# Patient Record
Sex: Female | Born: 1980 | Hispanic: No | Marital: Married | Smoking: Never smoker
Health system: Southern US, Community
[De-identification: ages and names within clinical notes are randomized; demographics above are authoritative.]

## PROBLEM LIST (undated history)

## (undated) ENCOUNTER — Emergency Department (HOSPITAL_COMMUNITY): Admission: EM | Payer: Medicaid Other

## (undated) ENCOUNTER — Inpatient Hospital Stay (HOSPITAL_COMMUNITY): Admission: AD | Payer: Medicaid Other

## (undated) DIAGNOSIS — Z789 Other specified health status: Secondary | ICD-10-CM

## (undated) HISTORY — PX: INDUCED ABORTION: SHX677

---

## 2011-05-05 LAB — OB RESULTS CONSOLE ABO/RH: RH Type: POSITIVE

## 2011-05-05 LAB — OB RESULTS CONSOLE RPR: RPR: NONREACTIVE

## 2011-05-05 LAB — OB RESULTS CONSOLE HEPATITIS B SURFACE ANTIGEN: Hepatitis B Surface Ag: NEGATIVE

## 2011-06-17 NOTE — L&D Delivery Note (Signed)
I was present and involved in delivery, and repaired the partial 3rd degree laceration.

## 2011-06-17 NOTE — L&D Delivery Note (Signed)
Operative Delivery Note At  a viable and healthy female was delivered via VAVD.  Presentation: vertex; Position: Right,, Occiput,, Anterior; Station: +3. Right posterior shoulder rotated clockwise until it was released.  Dr Emelda Fear actively assisting with delivery.  Verbal consent: obtained from patient.  Risks and benefits discussed in detail.  Risks include, but are not limited to the risks of anesthesia, bleeding, infection, damage to maternal tissues, fetal cephalhematoma.  There is also the risk of inability to effect vaginal delivery of the head, or shoulder dystocia that cannot be resolved by established maneuvers, leading to the need for emergency cesarean section.  APGAR: 8,9 ; weight pending.   Placenta status: intact, spontaneous.   Cord: 3-vessel with the following complications: None .    Anesthesia:  Epidural  Repair by Dr Emelda Fear: Instruments: Cheri Guppy used for visibility, Allis clamps x2 used to grasp sphincter capsule, and figure-of-eight sutures x2 used to approximate sphincter, interuppted 3.0 vicryl x1 used on perineal body, then 3.0 monocryl x1 used for skin approximation. Episiotomy: None Lacerations: Partial 3rd degree Est. Blood Loss (mL): 500  Mom to postpartum.  Baby to nursery-stable.  [name], Shastina Rua 12/14/2011, 10:42 AM

## 2011-11-03 ENCOUNTER — Encounter (HOSPITAL_COMMUNITY): Payer: Self-pay | Admitting: *Deleted

## 2011-11-03 ENCOUNTER — Inpatient Hospital Stay (HOSPITAL_COMMUNITY)
Admission: AD | Admit: 2011-11-03 | Discharge: 2011-11-03 | Disposition: A | Discharge status: Home / Self Care | Payer: Medicaid Other | Source: Ambulatory Visit | Attending: Obstetrics & Gynecology | Admitting: Obstetrics & Gynecology

## 2011-11-03 DIAGNOSIS — Z331 Pregnant state, incidental: Secondary | ICD-10-CM

## 2011-11-03 DIAGNOSIS — O99891 Other specified diseases and conditions complicating pregnancy: Secondary | ICD-10-CM | POA: Insufficient documentation

## 2011-11-03 HISTORY — DX: Other specified health status: Z78.9

## 2011-11-03 NOTE — MAU Note (Signed)
Patient states she just arrived in the [place] last week from [place] and wants to get established at this hospital. Has an appointment next week at the Clinic. Having no problems.

## 2011-11-03 NOTE — MAU Provider Note (Signed)
History     CSN: [id number]  Arrival date and time: 11/03/11 4696   None     Chief Complaint  Patient presents with  . Labor Eval   HPI Comments: Pt presenting following trans-atlantic flight because she "wanted her baby checked".  She has an appointment with [facility] 1 week and has no other complaints today.  She is a G2P0010 @ 35.5 estimated by 9 week [place].  Documentation from [place] indicating her EDD was 12/24/2011 which is incorrect as [place] provided by pt is more reliable.  Has no other complaints.  Minimal leg swelling B; not worsened.  No leg pain. No bleeding, no discharge. No headaches, no dyspnea, no orthopnea   OB History    Grav Para Term Preterm Abortions TAB SAB Ect Mult Living   2    1           Past Medical History  Diagnosis Date  . No pertinent past medical history     Past Surgical History  Procedure Date  . No past surgeries     Family History  Problem Relation Age of Onset  . Anesthesia problems Neg Hx   . Hypotension Neg Hx   . Malignant hyperthermia Neg Hx   . Pseudochol deficiency Neg Hx     History  Substance Use Topics  . Smoking status: Never Smoker   . Smokeless tobacco: Not on file  . Alcohol Use: No    Allergies: No Known Allergies  Prescriptions prior to admission  Medication Sig Dispense Refill  . Prenatal Vit-Fe Fumarate-FA (PRENATAL MULTIVITAMIN) TABS Take 1 tablet by mouth daily.        Review of Systems  Constitutional: Negative.  Negative for fever and chills.  HENT: Negative.   Eyes: Negative.   Respiratory: Negative.   Cardiovascular: Negative.   Gastrointestinal: Negative.   Genitourinary: Negative.   Musculoskeletal: Negative.   Skin: Negative.   Neurological: Negative.   Endo/Heme/Allergies: Negative.   Psychiatric/Behavioral: Negative.    Physical Exam   Blood pressure 115/74, pulse 93, temperature 98.2 F (36.8 C), resp. rate 18, height 5\' 2"  (1.575 m), weight 62.687 kg (138 lb 3.2  oz).  Physical Exam  Nursing note and vitals reviewed. Constitutional: She appears well-developed and well-nourished.  HENT:  Head: Normocephalic and atraumatic.  Eyes: Right eye exhibits no discharge. Left eye exhibits no discharge. No scleral icterus.  Cardiovascular: Normal rate, regular rhythm and intact distal pulses.  Exam reveals no gallop and no friction rub.   Murmur heard.      3+/6 systolic murmur  Respiratory: Effort normal and breath sounds normal. No respiratory distress. She has no wheezes. She has no rales.  GI: Soft. Bowel sounds are normal. She exhibits distension and mass. There is no tenderness. There is no rebound and no guarding.  Musculoskeletal: She exhibits edema.       Trace B edema  Skin: Skin is warm and dry.  Psychiatric: She has a normal mood and affect. Her behavior is normal. Judgment and thought content normal.    MAU Course  Procedures  MDM Pt with normal pregnancy who received prenatal care prior to arrival to [place].  Has no acute complaints.  No s/sx of complications including pre-eclampsia, DVT or CHF.  Pt was unaware of Heart Murmur.  Will need to be followed clinically, no indication for acute ECHO.  Assessment and Plan  Normal Pregnancy.  Follow up in [facility] as scheduled for remainder of prenatal labs  including Glucola.  Andrena Mews, DO [facility] Resident - PGY-1 11/03/2011 12:39 PM

## 2011-11-03 NOTE — MAU Provider Note (Signed)
Agree with Dr. Janeece Riggers note.

## 2011-11-03 NOTE — MAU Note (Signed)
Pt had a 17 hour flight and just wants a check-up today; has an appointment with our clinic next week;

## 2011-11-03 NOTE — Discharge Instructions (Signed)
Pregnancy - Third Trimester The third trimester of pregnancy (the last 3 months) is a period of the most rapid growth for you and your baby. The baby approaches a length of 20 inches and a weight of 6 to 10 pounds. The baby is adding on fat and getting ready for life outside your body. While inside, babies have periods of sleeping and waking, suck their thumbs, and hiccups. You can often feel small contractions of the uterus. This is false labor. It is also called Braxton-Hicks contractions. This is like a practice for labor. The usual problems in this stage of pregnancy include more difficulty breathing, swelling of the hands and feet from water retention, and having to urinate more often because of the uterus and baby pressing on your bladder.  PRENATAL EXAMS  Blood work may continue to be done during prenatal exams. These tests are done to check on your health and the probable health of your baby. Blood work is used to follow your blood levels (hemoglobin). Anemia (low hemoglobin) is common during pregnancy. Iron and vitamins are given to help prevent this. You may also continue to be checked for diabetes. Some of the past blood tests may be done again.   The size of the uterus is measured during each visit. This makes sure your baby is growing properly according to your pregnancy dates.   Your blood pressure is checked every prenatal visit. This is to make sure you are not getting toxemia.   Your urine is checked every prenatal visit for infection, diabetes and protein.   Your weight is checked at each visit. This is done to make sure gains are happening at the suggested rate and that you and your baby are growing normally.   Sometimes, an ultrasound is performed to confirm the position and the proper growth and development of the baby. This is a test done that bounces harmless sound waves off the baby so your caregiver can more accurately determine due dates.   Discuss the type of pain  medication and anesthesia you will have during your labor and delivery.   Discuss the possibility and anesthesia if a Cesarean Section might be necessary.   Inform your caregiver if there is any mental or physical violence at home.  Sometimes, a specialized non-stress test, contraction stress test and biophysical profile are done to make sure the baby is not having a problem. Checking the amniotic fluid surrounding the baby is called an amniocentesis. The amniotic fluid is removed by sticking a needle into the belly (abdomen). This is sometimes done near the end of pregnancy if an early delivery is required. In this case, it is done to help make sure the baby's lungs are mature enough for the baby to live outside of the womb. If the lungs are not mature and it is unsafe to deliver the baby, an injection of cortisone medication is given to the mother 1 to 2 days before the delivery. This helps the baby's lungs mature and makes it safer to deliver the baby. CHANGES OCCURING IN THE THIRD TRIMESTER OF PREGNANCY Your body goes through many changes during pregnancy. They vary from person to person. Talk to your caregiver about changes you notice and are concerned about.  During the last trimester, you have probably had an increase in your appetite. It is normal to have cravings for certain foods. This varies from person to person and pregnancy to pregnancy.   You may begin to get stretch marks on your hips,   abdomen, and breasts. These are normal changes in the body during pregnancy. There are no exercises or medications to take which prevent this change.   Constipation may be treated with a stool softener or adding bulk to your diet. Drinking lots of fluids, fiber in vegetables, fruits, and whole grains are helpful.   Exercising is also helpful. If you have been very active up until your pregnancy, most of these activities can be continued during your pregnancy. If you have been less active, it is helpful  to start an exercise program such as walking. Consult your caregiver before starting exercise programs.   Avoid all smoking, alcohol, un-prescribed drugs, herbs and "street drugs" during your pregnancy. These chemicals affect the formation and growth of the baby. Avoid chemicals throughout the pregnancy to ensure the delivery of a healthy infant.   Backache, varicose veins and hemorrhoids may develop or get worse.   You will tire more easily in the third trimester, which is normal.   The baby's movements may be stronger and more often.   You may become short of breath easily.   Your belly button may stick out.   A yellow discharge may leak from your breasts called colostrum.   You may have a bloody mucus discharge. This usually occurs a few days to a week before labor begins.  HOME CARE INSTRUCTIONS   Keep your caregiver's appointments. Follow your caregiver's instructions regarding medication use, exercise, and diet.   During pregnancy, you are providing food for you and your baby. Continue to eat regular, well-balanced meals. Choose foods such as meat, fish, milk and other low fat dairy products, vegetables, fruits, and whole-grain breads and cereals. Your caregiver will tell you of the ideal weight gain.   A physical sexual relationship may be continued throughout pregnancy if there are no other problems such as early (premature) leaking of amniotic fluid from the membranes, vaginal bleeding, or belly (abdominal) pain.   Exercise regularly if there are no restrictions. Check with your caregiver if you are unsure of the safety of your exercises. Greater weight gain will occur in the last 2 trimesters of pregnancy. Exercising helps:   Control your weight.   Get you in shape for labor and delivery.   You lose weight after you deliver.   Rest a lot with legs elevated, or as needed for leg cramps or low back pain.   Wear a good support or jogging bra for breast tenderness during  pregnancy. This may help if worn during sleep. Pads or tissues may be used in the bra if you are leaking colostrum.   Do not use hot tubs, steam rooms, or saunas.   Wear your seat belt when driving. This protects you and your baby if you are in an accident.   Avoid raw meat, cat litter boxes and soil used by cats. These carry germs that can cause birth defects in the baby.   It is easier to loose urine during pregnancy. Tightening up and strengthening the pelvic muscles will help with this problem. You can practice stopping your urination while you are going to the bathroom. These are the same muscles you need to strengthen. It is also the muscles you would use if you were trying to stop from passing gas. You can practice tightening these muscles up 10 times a set and repeating this about 3 times per day. Once you know what muscles to tighten up, do not perform these exercises during urination. It is more likely   to cause an infection by backing up the urine.   Ask for help if you have financial, counseling or nutritional needs during pregnancy. Your caregiver will be able to offer counseling for these needs as well as refer you for other special needs.   Make a list of emergency phone numbers and have them available.   Plan on getting help from family or friends when you go home from the hospital.   Make a trial run to the hospital.   Take prenatal classes with the father to understand, practice and ask questions about the labor and delivery.   Prepare the baby's room/nursery.   Do not travel out of the city unless it is absolutely necessary and with the advice of your caregiver.   Wear only low or no heal shoes to have better balance and prevent falling.  MEDICATIONS AND DRUG USE IN PREGNANCY  Take prenatal vitamins as directed. The vitamin should contain 1 milligram of folic acid. Keep all vitamins out of reach of children. Only a couple vitamins or tablets containing iron may be fatal  to a baby or young child when ingested.   Avoid use of all medications, including herbs, over-the-counter medications, not prescribed or suggested by your caregiver. Only take over-the-counter or prescription medicines for pain, discomfort, or fever as directed by your caregiver. Do not use aspirin, ibuprofen (Motrin, Advil, Nuprin) or naproxen (Aleve) unless OK'd by your caregiver.   Let your caregiver also know about herbs you may be using.   Alcohol is related to a number of birth defects. This includes fetal alcohol syndrome. All alcohol, in any form, should be avoided completely. Smoking will cause low birth rate and premature babies.   Street/illegal drugs are very harmful to the baby. They are absolutely forbidden. A baby born to an addicted mother will be addicted at birth. The baby will go through the same withdrawal an adult does.  SEEK MEDICAL CARE IF: You have any concerns or worries during your pregnancy. It is better to call with your questions if you feel they cannot wait, rather than worry about them. DECISIONS ABOUT CIRCUMCISION You may or may not know the sex of your baby. If you know your baby is a boy, it may be time to think about circumcision. Circumcision is the removal of the foreskin of the penis. This is the skin that covers the sensitive end of the penis. There is no proven medical need for this. Often this decision is made on what is popular at the time or based upon religious beliefs and social issues. You can discuss these issues with your caregiver or pediatrician. SEEK IMMEDIATE MEDICAL CARE IF:   An unexplained oral temperature above 102 F (38.9 C) develops, or as your caregiver suggests.   You have leaking of fluid from the vagina (birth canal). If leaking membranes are suspected, take your temperature and tell your caregiver of this when you call.   There is vaginal spotting, bleeding or passing clots. Tell your caregiver of the amount and how many pads are  used.   You develop a bad smelling vaginal discharge with a change in the color from clear to white.   You develop vomiting that lasts more than 24 hours.   You develop chills or fever.   You develop shortness of breath.   You develop burning on urination.   You loose more than 2 pounds of weight or gain more than 2 pounds of weight or as suggested by your   caregiver.   You notice sudden swelling of your face, hands, and feet or legs.   You develop belly (abdominal) pain. Round ligament discomfort is a common non-cancerous (benign) cause of abdominal pain in pregnancy. Your caregiver still must evaluate you.   You develop a severe headache that does not go away.   You develop visual problems, blurred or double vision.   If you have not felt your baby move for more than 1 hour. If you think the baby is not moving as much as usual, eat something with sugar in it and lie down on your left side for an hour. The baby should move at least 4 to 5 times per hour. Call right away if your baby moves less than that.   You fall, are in a car accident or any kind of trauma.   There is mental or physical violence at home.  Document Released: 05/27/2001 Document Revised: 05/22/2011 Document Reviewed: 11/29/2008 [employer] Patient Information 2012 [employer]. 

## 2011-11-12 ENCOUNTER — Ambulatory Visit (INDEPENDENT_AMBULATORY_CARE_PROVIDER_SITE_OTHER): Payer: Self-pay | Admitting: Obstetrics and Gynecology

## 2011-11-12 ENCOUNTER — Encounter: Payer: Self-pay | Admitting: Physician Assistant

## 2011-11-12 VITALS — BP 123/83 | Temp 98.2°F | Wt 139.6 lb

## 2011-11-12 DIAGNOSIS — Z3490 Encounter for supervision of normal pregnancy, unspecified, unspecified trimester: Secondary | ICD-10-CM

## 2011-11-12 DIAGNOSIS — Z3480 Encounter for supervision of other normal pregnancy, unspecified trimester: Secondary | ICD-10-CM

## 2011-11-12 DIAGNOSIS — Z3403 Encounter for supervision of normal first pregnancy, third trimester: Secondary | ICD-10-CM

## 2011-11-12 DIAGNOSIS — O0930 Supervision of pregnancy with insufficient antenatal care, unspecified trimester: Secondary | ICD-10-CM | POA: Insufficient documentation

## 2011-11-12 DIAGNOSIS — D259 Leiomyoma of uterus, unspecified: Secondary | ICD-10-CM | POA: Insufficient documentation

## 2011-11-12 LAB — POCT URINALYSIS DIP (DEVICE)
Glucose, UA: NEGATIVE mg/dL
Leukocytes, UA: NEGATIVE
Nitrite: NEGATIVE
Specific Gravity, Urine: 1.015 (ref 1.005–1.030)
Urobilinogen, UA: 0.2 mg/dL (ref 0.0–1.0)

## 2011-11-12 LAB — CBC
Platelets: 254 10*3/uL (ref 150–400)
RBC: 3.95 MIL/uL (ref 3.87–5.11)
WBC: 9.7 10*3/uL (ref 4.0–10.5)

## 2011-11-12 NOTE — Progress Notes (Signed)
Addended by: Doreen Salvage on: 11/12/2011 09:41 AM   Modules accepted: Orders

## 2011-11-12 NOTE — Progress Notes (Signed)
States had prenatal care in [place]- some records available. First visit in clinic today. 1+edema in feet, c/o feeling  Numbness, tingling in fingers/hands, Discussed appropriate weight gain, given prenatal booklet. States had flu shot this season back home. States had tdap last month in [place]

## 2011-11-12 NOTE — Progress Notes (Signed)
Reviewed dating criteria as pt thought she is 2 wks less than EDD 6/19 which is BEGA based on outside [place] at 106w5d (10 d variation from LMP; outside [place] at [redacted]w[redacted]d was 4 d variation from [place] #1). Records reviewed. CBC, HIV, Rubella, GC/CT, GBS, 1 hr glucose done. S/sx labor reviewed.

## 2011-11-12 NOTE — Patient Instructions (Signed)
Pregnancy - Third Trimester The third trimester of pregnancy (the last 3 months) is a period of the most rapid growth for you and your baby. The baby approaches a length of 20 inches and a weight of 6 to 10 pounds. The baby is adding on fat and getting ready for life outside your body. While inside, babies have periods of sleeping and waking, suck their thumbs, and hiccups. You can often feel small contractions of the uterus. This is false labor. It is also called Braxton-Hicks contractions. This is like a practice for labor. The usual problems in this stage of pregnancy include more difficulty breathing, swelling of the hands and feet from water retention, and having to urinate more often because of the uterus and baby pressing on your bladder.  PRENATAL EXAMS  Blood work may continue to be done during prenatal exams. These tests are done to check on your health and the probable health of your baby. Blood work is used to follow your blood levels (hemoglobin). Anemia (low hemoglobin) is common during pregnancy. Iron and vitamins are given to help prevent this. You may also continue to be checked for diabetes. Some of the past blood tests may be done again.   The size of the uterus is measured during each visit. This makes sure your baby is growing properly according to your pregnancy dates.   Your blood pressure is checked every prenatal visit. This is to make sure you are not getting toxemia.   Your urine is checked every prenatal visit for infection, diabetes and protein.   Your weight is checked at each visit. This is done to make sure gains are happening at the suggested rate and that you and your baby are growing normally.   Sometimes, an ultrasound is performed to confirm the position and the proper growth and development of the baby. This is a test done that bounces harmless sound waves off the baby so your caregiver can more accurately determine due dates.   Discuss the type of pain  medication and anesthesia you will have during your labor and delivery.   Discuss the possibility and anesthesia if a Cesarean Section might be necessary.   Inform your caregiver if there is any mental or physical violence at home.  Sometimes, a specialized non-stress test, contraction stress test and biophysical profile are done to make sure the baby is not having a problem. Checking the amniotic fluid surrounding the baby is called an amniocentesis. The amniotic fluid is removed by sticking a needle into the belly (abdomen). This is sometimes done near the end of pregnancy if an early delivery is required. In this case, it is done to help make sure the baby's lungs are mature enough for the baby to live outside of the womb. If the lungs are not mature and it is unsafe to deliver the baby, an injection of cortisone medication is given to the mother 1 to 2 days before the delivery. This helps the baby's lungs mature and makes it safer to deliver the baby. CHANGES OCCURING IN THE THIRD TRIMESTER OF PREGNANCY Your body goes through many changes during pregnancy. They vary from person to person. Talk to your caregiver about changes you notice and are concerned about.  During the last trimester, you have probably had an increase in your appetite. It is normal to have cravings for certain foods. This varies from person to person and pregnancy to pregnancy.   You may begin to get stretch marks on your hips,   abdomen, and breasts. These are normal changes in the body during pregnancy. There are no exercises or medications to take which prevent this change.   Constipation may be treated with a stool softener or adding bulk to your diet. Drinking lots of fluids, fiber in vegetables, fruits, and whole grains are helpful.   Exercising is also helpful. If you have been very active up until your pregnancy, most of these activities can be continued during your pregnancy. If you have been less active, it is helpful  to start an exercise program such as walking. Consult your caregiver before starting exercise programs.   Avoid all smoking, alcohol, un-prescribed drugs, herbs and "street drugs" during your pregnancy. These chemicals affect the formation and growth of the baby. Avoid chemicals throughout the pregnancy to ensure the delivery of a healthy infant.   Backache, varicose veins and hemorrhoids may develop or get worse.   You will tire more easily in the third trimester, which is normal.   The baby's movements may be stronger and more often.   You may become short of breath easily.   Your belly button may stick out.   A yellow discharge may leak from your breasts called colostrum.   You may have a bloody mucus discharge. This usually occurs a few days to a week before labor begins.  HOME CARE INSTRUCTIONS   Keep your caregiver's appointments. Follow your caregiver's instructions regarding medication use, exercise, and diet.   During pregnancy, you are providing food for you and your baby. Continue to eat regular, well-balanced meals. Choose foods such as meat, fish, milk and other low fat dairy products, vegetables, fruits, and whole-grain breads and cereals. Your caregiver will tell you of the ideal weight gain.   A physical sexual relationship may be continued throughout pregnancy if there are no other problems such as early (premature) leaking of amniotic fluid from the membranes, vaginal bleeding, or belly (abdominal) pain.   Exercise regularly if there are no restrictions. Check with your caregiver if you are unsure of the safety of your exercises. Greater weight gain will occur in the last 2 trimesters of pregnancy. Exercising helps:   Control your weight.   Get you in shape for labor and delivery.   You lose weight after you deliver.   Rest a lot with legs elevated, or as needed for leg cramps or low back pain.   Wear a good support or jogging bra for breast tenderness during  pregnancy. This may help if worn during sleep. Pads or tissues may be used in the bra if you are leaking colostrum.   Do not use hot tubs, steam rooms, or saunas.   Wear your seat belt when driving. This protects you and your baby if you are in an accident.   Avoid raw meat, cat litter boxes and soil used by cats. These carry germs that can cause birth defects in the baby.   It is easier to loose urine during pregnancy. Tightening up and strengthening the pelvic muscles will help with this problem. You can practice stopping your urination while you are going to the bathroom. These are the same muscles you need to strengthen. It is also the muscles you would use if you were trying to stop from passing gas. You can practice tightening these muscles up 10 times a set and repeating this about 3 times per day. Once you know what muscles to tighten up, do not perform these exercises during urination. It is more likely   to cause an infection by backing up the urine.   Ask for help if you have financial, counseling or nutritional needs during pregnancy. Your caregiver will be able to offer counseling for these needs as well as refer you for other special needs.   Make a list of emergency phone numbers and have them available.   Plan on getting help from family or friends when you go home from the hospital.   Make a trial run to the hospital.   Take prenatal classes with the father to understand, practice and ask questions about the labor and delivery.   Prepare the baby's room/nursery.   Do not travel out of the city unless it is absolutely necessary and with the advice of your caregiver.   Wear only low or no heal shoes to have better balance and prevent falling.  MEDICATIONS AND DRUG USE IN PREGNANCY  Take prenatal vitamins as directed. The vitamin should contain 1 milligram of folic acid. Keep all vitamins out of reach of children. Only a couple vitamins or tablets containing iron may be fatal  to a baby or young child when ingested.   Avoid use of all medications, including herbs, over-the-counter medications, not prescribed or suggested by your caregiver. Only take over-the-counter or prescription medicines for pain, discomfort, or fever as directed by your caregiver. Do not use aspirin, ibuprofen (Motrin, Advil, Nuprin) or naproxen (Aleve) unless OK'd by your caregiver.   Let your caregiver also know about herbs you may be using.   Alcohol is related to a number of birth defects. This includes fetal alcohol syndrome. All alcohol, in any form, should be avoided completely. Smoking will cause low birth rate and premature babies.   Street/illegal drugs are very harmful to the baby. They are absolutely forbidden. A baby born to an addicted mother will be addicted at birth. The baby will go through the same withdrawal an adult does.  SEEK MEDICAL CARE IF: You have any concerns or worries during your pregnancy. It is better to call with your questions if you feel they cannot wait, rather than worry about them. DECISIONS ABOUT CIRCUMCISION You may or may not know the sex of your baby. If you know your baby is a boy, it may be time to think about circumcision. Circumcision is the removal of the foreskin of the penis. This is the skin that covers the sensitive end of the penis. There is no proven medical need for this. Often this decision is made on what is popular at the time or based upon religious beliefs and social issues. You can discuss these issues with your caregiver or pediatrician. SEEK IMMEDIATE MEDICAL CARE IF:   An unexplained oral temperature above 102 F (38.9 C) develops, or as your caregiver suggests.   You have leaking of fluid from the vagina (birth canal). If leaking membranes are suspected, take your temperature and tell your caregiver of this when you call.   There is vaginal spotting, bleeding or passing clots. Tell your caregiver of the amount and how many pads are  used.   You develop a bad smelling vaginal discharge with a change in the color from clear to white.   You develop vomiting that lasts more than 24 hours.   You develop chills or fever.   You develop shortness of breath.   You develop burning on urination.   You loose more than 2 pounds of weight or gain more than 2 pounds of weight or as suggested by your   caregiver.   You notice sudden swelling of your face, hands, and feet or legs.   You develop belly (abdominal) pain. Round ligament discomfort is a common non-cancerous (benign) cause of abdominal pain in pregnancy. Your caregiver still must evaluate you.   You develop a severe headache that does not go away.   You develop visual problems, blurred or double vision.   If you have not felt your baby move for more than 1 hour. If you think the baby is not moving as much as usual, eat something with sugar in it and lie down on your left side for an hour. The baby should move at least 4 to 5 times per hour. Call right away if your baby moves less than that.   You fall, are in a car accident or any kind of trauma.   There is mental or physical violence at home.  Document Released: 05/27/2001 Document Revised: 05/22/2011 Document Reviewed: 11/29/2008 [employer] Patient Information 2012 [employer]. 

## 2011-11-13 LAB — RUBELLA SCREEN: Rubella: >500 IU/mL — ABNORMAL HIGH

## 2011-11-13 LAB — HIV ANTIBODY (ROUTINE TESTING W REFLEX): HIV: NONREACTIVE

## 2011-11-13 LAB — GLUCOSE TOLERANCE, 1 HOUR: Glucose, 1 Hour GTT: 121 mg/dL (ref 70–140)

## 2011-11-15 LAB — CULTURE, BETA STREP (GROUP B ONLY)

## 2011-11-19 ENCOUNTER — Ambulatory Visit (INDEPENDENT_AMBULATORY_CARE_PROVIDER_SITE_OTHER): Payer: Self-pay | Admitting: Advanced Practice Midwife

## 2011-11-19 ENCOUNTER — Encounter: Payer: Self-pay | Admitting: *Deleted

## 2011-11-19 DIAGNOSIS — O0930 Supervision of pregnancy with insufficient antenatal care, unspecified trimester: Secondary | ICD-10-CM

## 2011-11-19 LAB — POCT URINALYSIS DIP (DEVICE)
Hgb urine dipstick: NEGATIVE
Nitrite: NEGATIVE
Specific Gravity, Urine: 1.015 (ref 1.005–1.030)
Urobilinogen, UA: 0.2 mg/dL (ref 0.0–1.0)
pH: 7 (ref 5.0–8.0)

## 2011-11-19 NOTE — Progress Notes (Signed)
P = 107   Pt reports pain in Lt wrist- worse in the morning, gets less as the [place] progresses.

## 2011-11-19 NOTE — Progress Notes (Signed)
Doing well. Lots of FM. Few contractions. Reviewed normal lab results. Reviewed signs of labor, ROM, bleeding, decreased movement and where to come.  Subjective:    [name] is a 31 y.o. G2P0010 [redacted]w[redacted]d being seen today for her obstetrical visit.  Patient reports carpal tunnel symptoms. Fetal movement: normal.  Objective:    BP 126/84  Temp 97.6 F (36.4 C)  Wt 140 lb 1.6 oz (63.549 kg)  Physical Exam  Exam  FHT: Fetal Heart Rate (bpm): 144  Uterine Size: Fundal Height: 37 cm  Presentation: Presentation: Vertex     Assessment:    Pregnancy:  G2P0010    Plan:    Patient Active Problem List  Diagnoses  . Supervision of normal first pregnancy, third trimester  . Fibroid uterus    signs of labor Follow up in 1 Week.

## 2011-11-26 ENCOUNTER — Ambulatory Visit (INDEPENDENT_AMBULATORY_CARE_PROVIDER_SITE_OTHER): Payer: Self-pay | Admitting: Obstetrics and Gynecology

## 2011-11-26 VITALS — BP 114/72 | Temp 97.4°F | Wt 140.6 lb

## 2011-11-26 DIAGNOSIS — O0930 Supervision of pregnancy with insufficient antenatal care, unspecified trimester: Secondary | ICD-10-CM

## 2011-11-26 DIAGNOSIS — Z3400 Encounter for supervision of normal first pregnancy, unspecified trimester: Secondary | ICD-10-CM

## 2011-11-26 LAB — POCT URINALYSIS DIP (DEVICE)
Hgb urine dipstick: NEGATIVE
Leukocytes, UA: NEGATIVE
Nitrite: NEGATIVE
Protein, ur: NEGATIVE mg/dL
pH: 7 (ref 5.0–8.0)

## 2011-11-26 MED ORDER — PRENATAL MULTIVITAMIN CH: 1.0000 | ORAL_TABLET | Freq: Every day | ORAL | Status: DC

## 2011-11-26 NOTE — Progress Notes (Signed)
Pulse 95.   Edema +1 in feet. Vaginal d/c as thin, white; no odor, no itch.

## 2011-11-26 NOTE — Patient Instructions (Signed)
Pregnancy - Third Trimester The third trimester of pregnancy (the last 3 months) is a period of the most rapid growth for you and your baby. The baby approaches a length of 20 inches and a weight of 6 to 10 pounds. The baby is adding on fat and getting ready for life outside your body. While inside, babies have periods of sleeping and waking, suck their thumbs, and hiccups. You can often feel small contractions of the uterus. This is false labor. It is also called Braxton-Hicks contractions. This is like a practice for labor. The usual problems in this stage of pregnancy include more difficulty breathing, swelling of the hands and feet from water retention, and having to urinate more often because of the uterus and baby pressing on your bladder.  PRENATAL EXAMS  Blood work may continue to be done during prenatal exams. These tests are done to check on your health and the probable health of your baby. Blood work is used to follow your blood levels (hemoglobin). Anemia (low hemoglobin) is common during pregnancy. Iron and vitamins are given to help prevent this. You may also continue to be checked for diabetes. Some of the past blood tests may be done again.   The size of the uterus is measured during each visit. This makes sure your baby is growing properly according to your pregnancy dates.   Your blood pressure is checked every prenatal visit. This is to make sure you are not getting toxemia.   Your urine is checked every prenatal visit for infection, diabetes and protein.   Your weight is checked at each visit. This is done to make sure gains are happening at the suggested rate and that you and your baby are growing normally.   Sometimes, an ultrasound is performed to confirm the position and the proper growth and development of the baby. This is a test done that bounces harmless sound waves off the baby so your caregiver can more accurately determine due dates.   Discuss the type of pain  medication and anesthesia you will have during your labor and delivery.   Discuss the possibility and anesthesia if a Cesarean Section might be necessary.   Inform your caregiver if there is any mental or physical violence at home.  Sometimes, a specialized non-stress test, contraction stress test and biophysical profile are done to make sure the baby is not having a problem. Checking the amniotic fluid surrounding the baby is called an amniocentesis. The amniotic fluid is removed by sticking a needle into the belly (abdomen). This is sometimes done near the end of pregnancy if an early delivery is required. In this case, it is done to help make sure the baby's lungs are mature enough for the baby to live outside of the womb. If the lungs are not mature and it is unsafe to deliver the baby, an injection of cortisone medication is given to the mother 1 to 2 days before the delivery. This helps the baby's lungs mature and makes it safer to deliver the baby. CHANGES OCCURING IN THE THIRD TRIMESTER OF PREGNANCY Your body goes through many changes during pregnancy. They vary from person to person. Talk to your caregiver about changes you notice and are concerned about.  During the last trimester, you have probably had an increase in your appetite. It is normal to have cravings for certain foods. This varies from person to person and pregnancy to pregnancy.   You may begin to get stretch marks on your hips,   abdomen, and breasts. These are normal changes in the body during pregnancy. There are no exercises or medications to take which prevent this change.   Constipation may be treated with a stool softener or adding bulk to your diet. Drinking lots of fluids, fiber in vegetables, fruits, and whole grains are helpful.   Exercising is also helpful. If you have been very active up until your pregnancy, most of these activities can be continued during your pregnancy. If you have been less active, it is helpful  to start an exercise program such as walking. Consult your caregiver before starting exercise programs.   Avoid all smoking, alcohol, un-prescribed drugs, herbs and "street drugs" during your pregnancy. These chemicals affect the formation and growth of the baby. Avoid chemicals throughout the pregnancy to ensure the delivery of a healthy infant.   Backache, varicose veins and hemorrhoids may develop or get worse.   You will tire more easily in the third trimester, which is normal.   The baby's movements may be stronger and more often.   You may become short of breath easily.   Your belly button may stick out.   A yellow discharge may leak from your breasts called colostrum.   You may have a bloody mucus discharge. This usually occurs a few days to a week before labor begins.  HOME CARE INSTRUCTIONS   Keep your caregiver's appointments. Follow your caregiver's instructions regarding medication use, exercise, and diet.   During pregnancy, you are providing food for you and your baby. Continue to eat regular, well-balanced meals. Choose foods such as meat, fish, milk and other low fat dairy products, vegetables, fruits, and whole-grain breads and cereals. Your caregiver will tell you of the ideal weight gain.   A physical sexual relationship may be continued throughout pregnancy if there are no other problems such as early (premature) leaking of amniotic fluid from the membranes, vaginal bleeding, or belly (abdominal) pain.   Exercise regularly if there are no restrictions. Check with your caregiver if you are unsure of the safety of your exercises. Greater weight gain will occur in the last 2 trimesters of pregnancy. Exercising helps:   Control your weight.   Get you in shape for labor and delivery.   You lose weight after you deliver.   Rest a lot with legs elevated, or as needed for leg cramps or low back pain.   Wear a good support or jogging bra for breast tenderness during  pregnancy. This may help if worn during sleep. Pads or tissues may be used in the bra if you are leaking colostrum.   Do not use hot tubs, steam rooms, or saunas.   Wear your seat belt when driving. This protects you and your baby if you are in an accident.   Avoid raw meat, cat litter boxes and soil used by cats. These carry germs that can cause birth defects in the baby.   It is easier to loose urine during pregnancy. Tightening up and strengthening the pelvic muscles will help with this problem. You can practice stopping your urination while you are going to the bathroom. These are the same muscles you need to strengthen. It is also the muscles you would use if you were trying to stop from passing gas. You can practice tightening these muscles up 10 times a set and repeating this about 3 times per day. Once you know what muscles to tighten up, do not perform these exercises during urination. It is more likely   to cause an infection by backing up the urine.   Ask for help if you have financial, counseling or nutritional needs during pregnancy. Your caregiver will be able to offer counseling for these needs as well as refer you for other special needs.   Make a list of emergency phone numbers and have them available.   Plan on getting help from family or friends when you go home from the hospital.   Make a trial run to the hospital.   Take prenatal classes with the father to understand, practice and ask questions about the labor and delivery.   Prepare the baby's room/nursery.   Do not travel out of the city unless it is absolutely necessary and with the advice of your caregiver.   Wear only low or no heal shoes to have better balance and prevent falling.  MEDICATIONS AND DRUG USE IN PREGNANCY  Take prenatal vitamins as directed. The vitamin should contain 1 milligram of folic acid. Keep all vitamins out of reach of children. Only a couple vitamins or tablets containing iron may be fatal  to a baby or young child when ingested.   Avoid use of all medications, including herbs, over-the-counter medications, not prescribed or suggested by your caregiver. Only take over-the-counter or prescription medicines for pain, discomfort, or fever as directed by your caregiver. Do not use aspirin, ibuprofen (Motrin, Advil, Nuprin) or naproxen (Aleve) unless OK'd by your caregiver.   Let your caregiver also know about herbs you may be using.   Alcohol is related to a number of birth defects. This includes fetal alcohol syndrome. All alcohol, in any form, should be avoided completely. Smoking will cause low birth rate and premature babies.   Street/illegal drugs are very harmful to the baby. They are absolutely forbidden. A baby born to an addicted mother will be addicted at birth. The baby will go through the same withdrawal an adult does.  SEEK MEDICAL CARE IF: You have any concerns or worries during your pregnancy. It is better to call with your questions if you feel they cannot wait, rather than worry about them. DECISIONS ABOUT CIRCUMCISION You may or may not know the sex of your baby. If you know your baby is a boy, it may be time to think about circumcision. Circumcision is the removal of the foreskin of the penis. This is the skin that covers the sensitive end of the penis. There is no proven medical need for this. Often this decision is made on what is popular at the time or based upon religious beliefs and social issues. You can discuss these issues with your caregiver or pediatrician. SEEK IMMEDIATE MEDICAL CARE IF:   An unexplained oral temperature above 102 F (38.9 C) develops, or as your caregiver suggests.   You have leaking of fluid from the vagina (birth canal). If leaking membranes are suspected, take your temperature and tell your caregiver of this when you call.   There is vaginal spotting, bleeding or passing clots. Tell your caregiver of the amount and how many pads are  used.   You develop a bad smelling vaginal discharge with a change in the color from clear to white.   You develop vomiting that lasts more than 24 hours.   You develop chills or fever.   You develop shortness of breath.   You develop burning on urination.   You loose more than 2 pounds of weight or gain more than 2 pounds of weight or as suggested by your   caregiver.   You notice sudden swelling of your face, hands, and feet or legs.   You develop belly (abdominal) pain. Round ligament discomfort is a common non-cancerous (benign) cause of abdominal pain in pregnancy. Your caregiver still must evaluate you.   You develop a severe headache that does not go away.   You develop visual problems, blurred or double vision.   If you have not felt your baby move for more than 1 hour. If you think the baby is not moving as much as usual, eat something with sugar in it and lie down on your left side for an hour. The baby should move at least 4 to 5 times per hour. Call right away if your baby moves less than that.   You fall, are in a car accident or any kind of trauma.   There is mental or physical violence at home.  Document Released: 05/27/2001 Document Revised: 05/22/2011 Document Reviewed: 11/29/2008 [employer] Patient Information 2012 [employer]. 

## 2011-11-26 NOTE — Progress Notes (Signed)
S/sx labor, plans reviewed. EFW 7#

## 2011-12-03 ENCOUNTER — Ambulatory Visit (INDEPENDENT_AMBULATORY_CARE_PROVIDER_SITE_OTHER): Payer: Self-pay | Admitting: Advanced Practice Midwife

## 2011-12-03 ENCOUNTER — Encounter: Payer: Self-pay | Admitting: Advanced Practice Midwife

## 2011-12-03 VITALS — BP 113/70 | Temp 97.7°F | Wt 142.0 lb

## 2011-12-03 DIAGNOSIS — O480 Post-term pregnancy: Secondary | ICD-10-CM

## 2011-12-03 DIAGNOSIS — O0930 Supervision of pregnancy with insufficient antenatal care, unspecified trimester: Secondary | ICD-10-CM

## 2011-12-03 LAB — POCT URINALYSIS DIP (DEVICE)
Leukocytes, UA: NEGATIVE
Protein, ur: NEGATIVE mg/dL
Specific Gravity, Urine: 1.02 (ref 1.005–1.030)
Urobilinogen, UA: 0.2 mg/dL (ref 0.0–1.0)
pH: 7 (ref 5.0–8.0)

## 2011-12-03 NOTE — Patient Instructions (Addendum)
Normal Labor and Delivery Your caregiver must first be sure you are in labor. Signs of labor include:  You may pass what is called "the mucus plug" before labor begins. This is a small amount of blood stained mucus.   Regular uterine contractions.   The time between contractions get closer together.   The discomfort and pain gradually gets more intense.   Pains are mostly located in the back.   Pains get worse when walking.   The cervix (the opening of the uterus becomes thinner (begins to efface) and opens up (dilates).  Once you are in labor and admitted into the hospital or care center, your caregiver will do the following:  A complete physical examination.   Check your vital signs (blood pressure, pulse, temperature and the fetal heart rate).   Do a vaginal examination (using a sterile glove and lubricant) to determine:   The position (presentation) of the baby (head [vertex] or buttock first).   The level (station) of the baby's head in the birth canal.   The effacement and dilatation of the cervix.   You may have your pubic hair shaved and be given an enema depending on your caregiver and the circumstance.   An electronic monitor is usually placed on your abdomen. The monitor follows the length and intensity of the contractions, as well as the baby's heart rate.   Usually, your caregiver will insert an IV in your arm with a bottle of sugar water. This is done as a precaution so that medications can be given to you quickly during labor or delivery.  NORMAL LABOR AND DELIVERY IS DIVIDED UP INTO 3 STAGES: First Stage This is when regular contractions begin and the cervix begins to efface and dilate. This stage can last from 3 to 15 hours. The end of the first stage is when the cervix is 100% effaced and 10 centimeters dilated. Pain medications may be given by   Injection (morphine, demerol, etc.)   Regional anesthesia (spinal, caudal or epidural, anesthetics given in  different locations of the spine). Paracervical pain medication may be given, which is an injection of and anesthetic on each side of the cervix.  A pregnant woman may request to have "Natural Childbirth" which is not to have any medications or anesthesia during her labor and delivery. Second Stage This is when the baby comes down through the birth canal (vagina) and is born. This can take 1 to 4 hours. As the baby's head comes down through the birth canal, you may feel like you are going to have a bowel movement. You will get the urge to bear down and push until the baby is delivered. As the baby's head is being delivered, the caregiver will decide if an episiotomy (a cut in the perineum and vagina area) is needed to prevent tearing of the tissue in this area. The episiotomy is sewn up after the delivery of the baby and placenta. Sometimes a mask with nitrous oxide is given for the mother to breath during the delivery of the baby to help if there is too much pain. The end of Stage 2 is when the baby is fully delivered. Then when the umbilical cord stops pulsating it is clamped and cut. Third Stage The third stage begins after the baby is completely delivered and ends after the placenta (afterbirth) is delivered. This usually takes 5 to 30 minutes. After the placenta is delivered, a medication is given either by intravenous or injection to help contract   the uterus and prevent bleeding. The third stage is not painful and pain medication is usually not necessary. If an episiotomy was done, it is repaired at this time. After the delivery, the mother is watched and monitored closely for 1 to 2 hours to make sure there is no postpartum bleeding (hemorrhage). If there is a lot of bleeding, medication is given to contract the uterus and stop the bleeding. Document Released: 03/11/2008 Document Revised: 05/22/2011 Document Reviewed: 03/11/2008 [employer] Patient Information 2012 [employer]. 

## 2011-12-03 NOTE — Progress Notes (Signed)
Pulse: 95 Edema in feet. Vaginal d/c as white; no odor, no itch.

## 2011-12-03 NOTE — Progress Notes (Signed)
[place] for AFI on 12/08/11 @ 1345

## 2011-12-03 NOTE — Progress Notes (Signed)
Doing well Will get NST today. Will schedule IOL for next week on Sat. June 29 at 0730.

## 2011-12-06 ENCOUNTER — Encounter: Payer: Self-pay | Admitting: Advanced Practice Midwife

## 2011-12-08 ENCOUNTER — Telehealth (HOSPITAL_COMMUNITY): Payer: Self-pay | Admitting: *Deleted

## 2011-12-08 ENCOUNTER — Ambulatory Visit (INDEPENDENT_AMBULATORY_CARE_PROVIDER_SITE_OTHER): Payer: Medicaid Other | Admitting: *Deleted

## 2011-12-08 ENCOUNTER — Encounter (HOSPITAL_COMMUNITY): Payer: Self-pay | Admitting: *Deleted

## 2011-12-08 ENCOUNTER — Ambulatory Visit (HOSPITAL_COMMUNITY)
Admission: RE | Admit: 2011-12-08 | Discharge: 2011-12-08 | Disposition: A | Discharge status: Home / Self Care | Payer: Medicaid Other | Source: Ambulatory Visit | Attending: Advanced Practice Midwife | Admitting: Advanced Practice Midwife

## 2011-12-08 VITALS — BP 119/66 | Temp 99.1°F | Wt 141.8 lb

## 2011-12-08 DIAGNOSIS — O3410 Maternal care for benign tumor of corpus uteri, unspecified trimester: Secondary | ICD-10-CM | POA: Insufficient documentation

## 2011-12-08 DIAGNOSIS — Z8742 Personal history of other diseases of the female genital tract: Secondary | ICD-10-CM

## 2011-12-08 DIAGNOSIS — Z8759 Personal history of other complications of pregnancy, childbirth and the puerperium: Secondary | ICD-10-CM

## 2011-12-08 DIAGNOSIS — O480 Post-term pregnancy: Secondary | ICD-10-CM

## 2011-12-08 NOTE — Progress Notes (Signed)
P=85, here for nst 

## 2011-12-08 NOTE — Telephone Encounter (Signed)
Preadmission screen  

## 2011-12-10 ENCOUNTER — Encounter: Payer: Self-pay | Admitting: Family Medicine

## 2011-12-10 ENCOUNTER — Ambulatory Visit (INDEPENDENT_AMBULATORY_CARE_PROVIDER_SITE_OTHER): Payer: Medicaid Other | Admitting: Family Medicine

## 2011-12-10 VITALS — BP 123/73 | Temp 97.9°F | Wt 142.9 lb

## 2011-12-10 DIAGNOSIS — O480 Post-term pregnancy: Secondary | ICD-10-CM

## 2011-12-10 DIAGNOSIS — O0930 Supervision of pregnancy with insufficient antenatal care, unspecified trimester: Secondary | ICD-10-CM

## 2011-12-10 LAB — POCT URINALYSIS DIP (DEVICE)
Glucose, UA: NEGATIVE mg/dL
Hgb urine dipstick: NEGATIVE
Nitrite: NEGATIVE
Protein, ur: NEGATIVE mg/dL
Specific Gravity, Urine: 1.015 (ref 1.005–1.030)
Urobilinogen, UA: 0.2 mg/dL (ref 0.0–1.0)

## 2011-12-10 NOTE — Progress Notes (Signed)
Pulse 82 Edema +2 on feet. Vaginal d/c as thin white; no odor, no itch.

## 2011-12-10 NOTE — Progress Notes (Incomplete)
6/24 NST reviewed and reactive 

## 2011-12-10 NOTE — Patient Instructions (Addendum)
Labor Induction  Most women go into labor on their own between 23 and 42 weeks of the pregnancy. When this does not happen or when there is a medical need, medicine or other methods may be used to induce labor. Labor induction causes a pregnant woman's uterus to contract. It also causes the cervix to soften (ripen), open (dilate), and thin out (efface). Usually, labor is not induced before 39 weeks of the pregnancy unless there is a problem with the baby or mother. Whether your labor will be induced depends on a number of factors, including the following:  The medical condition of you and the baby.   How many weeks along you are.   The status of baby's lung maturity.   The condition of the cervix.   The position of the baby.  REASONS FOR LABOR INDUCTION  The health of the baby or mother is at risk.   The pregnancy is overdue by 1 week or more.   The water breaks but labor does not start on its own.   The mother has a health condition or serious illness such as high blood pressure, infection, placental abruption, or diabetes.   The amniotic fluid amounts are low around the baby.   The baby is distressed.  REASONS TO NOT INDUCE LABOR Labor induction may not be a good idea if:  It is shown that your baby does not tolerate labor.   An induction is just more convenient.   You want the baby to be born on a certain date, like a holiday.   You have had previous surgeries on your uterus, such as a myomectomy or the removal of fibroids.   Your placenta lies very low in the uterus and blocks the opening of the cervix (placenta previa).   Your baby is not in a head down position.   The umbilical cord drops down into the birth canal in front of the baby. This could cut off the baby's blood and oxygen supply.   You have had a previous cesarean delivery.   There areunusual circumstances, such as the baby being extremely premature.  RISKS AND COMPLICATIONS Problems may occur in the  process of induction and plans may need to be modified as a situation unfolds. Some of the risks of induction include:  Change in fetal heart rate, such as too high, too low, or erratic.   Risk of fetal distress.   Risk of infection to mother and baby.   Increased chance of having a cesarean delivery.   The rare, but increased chance that the placenta will separate from the uterus (abruption).   Uterine rupture (very rare).  When induction is needed for medical reasons, the benefits of induction may outweigh the risks. BEFORE THE PROCEDURE Your caregiver will check your cervix and the baby's position. This will help your caregiver decide if you are far enough along for an induction to work. PROCEDURE Several methods of labor induction may be used, such as:   Taking prostaglandin medicine to dilate and ripen the cervix. The medicine will also start contractions. It can be taken by mouth or by inserting a suppository into the vagina.   A thin tube (catheter) with a balloon on the end may be inserted into your vagina to dilate the cervix. Once inserted, the balloon expands with water, which causes the cervix to open.   Striping the membranes. Your caregiver inserts a finger between the cervix and membranes, which causes the cervix to be stretched and  may cause the uterus to contract. This is often done during an office visit. You will be sent home to wait for the contractions to begin. You will then come in for an induction.   Breaking the water. Your caregiver will make a hole in the amniotic sac using a small instrument. Once the amniotic sac breaks, contractions should begin. This may still take hours to see an effect.   Taking medicine to trigger or strengthen contractions. This medicine is given intravenously through a tube in your arm.  All of the methods of induction, besides stripping the membranes, will be done in the hospital. Induction is done in the hospital so that you and the  baby can be carefully monitored. AFTER THE PROCEDURE Some inductions can take up to 2 or 3 days. Depending on the cervix, it usually takes less time. It takes longer when you are induced early in the pregnancy or if this is your first pregnancy. If a mother is still pregnant and the induction has been going on for 2 to 3 days, either the mother will be sent home or a cesarean delivery will be needed. Document Released: 10/22/2006 Document Revised: 05/22/2011 Document Reviewed: 04/07/2011 [facility] Patient Information 2012 [place], [place]. Breastfeeding BENEFITS OF BREASTFEEDING For the baby  The first milk (colostrum) helps the baby's digestive system function better.   There are antibodies from the mother in the milk that help the baby fight off infections.   The baby has a lower incidence of asthma, allergies, and SIDS (sudden infant death syndrome).   The nutrients in breast milk are better than formulas for the baby and helps the baby's brain grow better.   Babies who breastfeed have less gas, colic, and constipation.  For the mother  Breastfeeding helps develop a very special bond between mother and baby.   It is more convenient, always available at the correct temperature and cheaper than formula feeding.   It burns calories in the mother and helps with losing weight that was gained during pregnancy.   It makes the uterus contract back down to normal size faster and slows bleeding following delivery.   Breastfeeding mothers have a lower risk of developing breast cancer.  NURSE FREQUENTLY  A healthy, full-term baby may breastfeed as often as every hour or space his or her feedings to every 3 hours.   How often to nurse will vary from baby to baby. Watch your baby for signs of hunger, not the clock.   Nurse as often as the baby requests, or when you feel the need to reduce the fullness of your breasts.   Awaken the baby if it has been 3 to 4 hours since the last feeding.    Frequent feeding will help the mother make more milk and will prevent problems like sore nipples and engorgement of the breasts.  BABY'S POSITION AT THE BREAST  Whether lying down or sitting, be sure that the baby's tummy is facing your tummy.   Support the breast with 4 fingers underneath the breast and the thumb above. Make sure your fingers are well away from the nipple and baby's mouth.   Stroke the baby's lips and cheek closest to the breast gently with your finger or nipple.   When the baby's mouth is open wide enough, place all of your nipple and as much of the dark area around the nipple as possible into your baby's mouth.   Pull the baby in close so the tip of the nose  and the baby's cheeks touch the breast during the feeding.  FEEDINGS  The length of each feeding varies from baby to baby and from feeding to feeding.   The baby must suck about 2 to 3 minutes for your milk to get to him or her. This is called a "let down." For this reason, allow the baby to feed on each breast as long as he or she wants. Your baby will end the feeding when he or she has received the right balance of nutrients.   To break the suction, put your finger into the corner of the baby's mouth and slide it between his or her gums before removing your breast from his or her mouth. This will help prevent sore nipples.  REDUCING BREAST ENGORGEMENT  In the first week after your baby is born, you may experience signs of breast engorgement. When breasts are engorged, they feel heavy, warm, full, and may be tender to the touch. You can reduce engorgement if you:   Nurse frequently, every 2 to 3 hours. Mothers who breastfeed early and often have fewer problems with engorgement.   Place light ice packs on your breasts between feedings. This reduces swelling. Wrap the ice packs in a lightweight towel to protect your skin.   Apply moist hot packs to your breast for 5 to 10 minutes before each feeding. This  increases circulation and helps the milk flow.   Gently massage your breast before and during the feeding.   Make sure that the baby empties at least one breast at every feeding before switching sides.   Use a breast pump to empty the breasts if your baby is sleepy or not nursing well. You may also want to pump if you are returning to work or or you feel you are getting engorged.   Avoid bottle feeds, pacifiers or supplemental feedings of water or juice in place of breastfeeding.   Be sure the baby is latched on and positioned properly while breastfeeding.   Prevent fatigue, stress, and anemia.   Wear a supportive bra, avoiding underwire styles.   Eat a balanced diet with enough fluids.  If you follow these suggestions, your engorgement should improve in 24 to 48 hours. If you are still experiencing difficulty, call your lactation consultant or caregiver. IS MY BABY GETTING ENOUGH MILK? Sometimes, mothers worry about whether their babies are getting enough milk. You can be assured that your baby is getting enough milk if:  The baby is actively sucking and you hear swallowing.   The baby nurses at least 8 to 12 times in a 24 hour time period. Nurse your baby until he or she unlatches or falls asleep at the first breast (at least 10 to 20 minutes), then offer the second side.   The baby is wetting 5 to 6 disposable diapers (6 to 8 cloth diapers) in a 24 hour period by 39 to 74 days of age.   The baby is having at least 2 to 3 stools every 24 hours for the first few months. Breast milk is all the food your baby needs. It is not necessary for your baby to have water or formula. In fact, to help your breasts make more milk, it is best not to give your baby supplemental feedings during the early weeks.   The stool should be soft and yellow.   The baby should gain 4 to 7 ounces per week after he is 43 days old.  TAKE CARE OF YOURSELF  Take care of your breasts by:  Bathing or showering daily.    Avoiding the use of soaps on your nipples.   Start feedings on your left breast at one feeding and on your right breast at the next feeding.   You will notice an increase in your milk supply 2 to 5 days after delivery. You may feel some discomfort from engorgement, which makes your breasts very firm and often tender. Engorgement "peaks" out within 24 to 48 hours. In the meantime, apply warm moist towels to your breasts for 5 to 10 minutes before feeding. Gentle massage and expression of some milk before feeding will soften your breasts, making it easier for your baby to latch on. Wear a well fitting nursing bra and air dry your nipples for 10 to 15 minutes after each feeding.   Only use cotton bra pads.   Only use pure lanolin on your nipples after nursing. You do not need to wash it off before nursing.  Take care of yourself by:   Eating well-balanced meals and nutritious snacks.   Drinking milk, fruit juice, and water to satisfy your thirst (about 8 glasses a day).   Getting plenty of rest.   Increasing calcium in your diet (1200 mg a day).   Avoiding foods that you notice affect the baby in a bad way.  SEEK MEDICAL CARE IF:   You have any questions or difficulty with breastfeeding.   You need help.   You have a hard, red, sore area on your breast, accompanied by a fever of 100.5 F (38.1 C) or more.   Your baby is too sleepy to eat well or is having trouble sleeping.   Your baby is wetting less than 6 diapers per day, by 35 days of age.   Your baby's skin or white part of his or her eyes is more yellow than it was in the hospital.   You feel depressed.  Document Released: 06/02/2005 Document Revised: 05/22/2011 Document Reviewed: 01/15/2009 [facility] Patient Information 2012 [place], [place].

## 2011-12-10 NOTE — Progress Notes (Signed)
Doing well--for IOL on Saturday

## 2011-12-13 ENCOUNTER — Encounter (HOSPITAL_COMMUNITY): Payer: Self-pay

## 2011-12-13 ENCOUNTER — Inpatient Hospital Stay (HOSPITAL_COMMUNITY)
Admission: RE | Admit: 2011-12-13 | Discharge: 2011-12-16 | DRG: 775 | Disposition: A | Discharge status: Home / Self Care | Payer: Medicaid Other | Source: Ambulatory Visit | Attending: Obstetrics & Gynecology | Admitting: Obstetrics & Gynecology

## 2011-12-13 ENCOUNTER — Inpatient Hospital Stay (HOSPITAL_COMMUNITY): Payer: Medicaid Other | Admitting: Anesthesiology

## 2011-12-13 ENCOUNTER — Encounter (HOSPITAL_COMMUNITY): Payer: Self-pay | Admitting: Anesthesiology

## 2011-12-13 VITALS — BP 128/82 | HR 66 | Temp 97.9°F | Resp 18 | Ht 63.0 in | Wt 142.0 lb

## 2011-12-13 DIAGNOSIS — O0930 Supervision of pregnancy with insufficient antenatal care, unspecified trimester: Secondary | ICD-10-CM

## 2011-12-13 DIAGNOSIS — O480 Post-term pregnancy: Principal | ICD-10-CM | POA: Diagnosis present

## 2011-12-13 LAB — CBC
MCH: 30.5 pg (ref 26.0–34.0)
MCHC: 33.3 g/dL (ref 30.0–36.0)
MCV: 91.4 fL (ref 78.0–100.0)
Platelets: 260 10*3/uL (ref 150–400)
RBC: 3.94 MIL/uL (ref 3.87–5.11)
RDW: 14.3 % (ref 11.5–15.5)

## 2011-12-13 LAB — ABO/RH: ABO/RH(D): B POS

## 2011-12-13 LAB — RPR: RPR Ser Ql: NONREACTIVE

## 2011-12-13 MED ORDER — FLEET ENEMA 7-19 GM/118ML RE ENEM: 1.0000 | ENEMA | RECTAL | Status: DC | PRN

## 2011-12-13 MED ORDER — EPHEDRINE 5 MG/ML INJ: 10.0000 mg | INTRAVENOUS | Status: DC | PRN

## 2011-12-13 MED ORDER — EPHEDRINE 5 MG/ML INJ
10.0000 mg | INTRAVENOUS | Status: DC | PRN
  Filled 2011-12-13: qty 4

## 2011-12-13 MED ORDER — IBUPROFEN 600 MG PO TABS: 600.0000 mg | ORAL_TABLET | Freq: Four times a day (QID) | ORAL | Status: DC | PRN

## 2011-12-13 MED ORDER — LIDOCAINE HCL (PF) 1 % IJ SOLN
INTRAMUSCULAR | Status: DC | PRN
  Administered 2011-12-13 (×4): 4 mL

## 2011-12-13 MED ORDER — FENTANYL 2.5 MCG/ML BUPIVACAINE 1/10 % EPIDURAL INFUSION (WH - ANES)
10.0000 mL/h | INTRAMUSCULAR | Status: DC
  Administered 2011-12-13 – 2011-12-14 (×2): 14 mL/h via EPIDURAL
  Administered 2011-12-14: 10 mL/h via EPIDURAL
  Administered 2011-12-14: 14 mL/h via EPIDURAL
  Filled 2011-12-13 (×3): qty 60

## 2011-12-13 MED ORDER — OXYTOCIN BOLUS FROM INFUSION
250.0000 mL | Freq: Once | INTRAVENOUS | Status: AC
  Administered 2011-12-14: 250 mL via INTRAVENOUS
  Filled 2011-12-13: qty 500

## 2011-12-13 MED ORDER — OXYTOCIN 40 UNITS IN LACTATED RINGERS INFUSION - SIMPLE MED
62.5000 mL/h | Freq: Once | INTRAVENOUS | Status: AC
  Administered 2011-12-14: 62.5 mL/h via INTRAVENOUS

## 2011-12-13 MED ORDER — DIPHENHYDRAMINE HCL 50 MG/ML IJ SOLN: 12.5000 mg | INTRAMUSCULAR | Status: DC | PRN

## 2011-12-13 MED ORDER — TERBUTALINE SULFATE 1 MG/ML IJ SOLN: 0.2500 mg | Freq: Once | INTRAMUSCULAR | Status: AC | PRN

## 2011-12-13 MED ORDER — LACTATED RINGERS IV SOLN
500.0000 mL | Freq: Once | INTRAVENOUS | Status: AC
  Administered 2011-12-13: 500 mL via INTRAVENOUS

## 2011-12-13 MED ORDER — LACTATED RINGERS IV SOLN
500.0000 mL | INTRAVENOUS | Status: DC | PRN
Start: 2011-12-13 — End: 2011-12-14
  Administered 2011-12-13: 1000 mL via INTRAVENOUS

## 2011-12-13 MED ORDER — LACTATED RINGERS IV SOLN
INTRAVENOUS | Status: DC
  Administered 2011-12-13 – 2011-12-14 (×3): via INTRAVENOUS

## 2011-12-13 MED ORDER — LIDOCAINE HCL (PF) 1 % IJ SOLN
30.0000 mL | INTRAMUSCULAR | Status: DC | PRN
  Administered 2011-12-14: 30 mL via SUBCUTANEOUS
  Filled 2011-12-13: qty 30

## 2011-12-13 MED ORDER — PHENYLEPHRINE 40 MCG/ML (10ML) SYRINGE FOR IV PUSH (FOR BLOOD PRESSURE SUPPORT): 80.0000 ug | PREFILLED_SYRINGE | INTRAVENOUS | Status: DC | PRN

## 2011-12-13 MED ORDER — CITRIC ACID-SODIUM CITRATE 334-500 MG/5ML PO SOLN: 30.0000 mL | ORAL | Status: DC | PRN

## 2011-12-13 MED ORDER — FENTANYL CITRATE 0.05 MG/ML IJ SOLN
100.0000 ug | INTRAMUSCULAR | Status: DC | PRN
  Administered 2011-12-13 (×2): 100 ug via INTRAVENOUS
  Filled 2011-12-13 (×2): qty 2

## 2011-12-13 MED ORDER — PHENYLEPHRINE 40 MCG/ML (10ML) SYRINGE FOR IV PUSH (FOR BLOOD PRESSURE SUPPORT)
80.0000 ug | PREFILLED_SYRINGE | INTRAVENOUS | Status: DC | PRN
  Filled 2011-12-13: qty 5

## 2011-12-13 MED ORDER — OXYCODONE-ACETAMINOPHEN 5-325 MG PO TABS: 1.0000 | ORAL_TABLET | ORAL | Status: DC | PRN

## 2011-12-13 MED ORDER — ACETAMINOPHEN 325 MG PO TABS: 650.0000 mg | ORAL_TABLET | ORAL | Status: DC | PRN

## 2011-12-13 MED ORDER — MISOPROSTOL 25 MCG QUARTER TABLET
25.0000 ug | ORAL_TABLET | ORAL | Status: DC | PRN
  Administered 2011-12-13 (×2): 25 ug via VAGINAL
  Filled 2011-12-13 (×3): qty 0.25

## 2011-12-13 MED ORDER — OXYTOCIN 40 UNITS IN LACTATED RINGERS INFUSION - SIMPLE MED
1.0000 m[IU]/min | INTRAVENOUS | Status: DC
  Administered 2011-12-13: 2 m[IU]/min via INTRAVENOUS
  Filled 2011-12-13: qty 1000

## 2011-12-13 MED ORDER — ONDANSETRON HCL 4 MG/2ML IJ SOLN
4.0000 mg | Freq: Four times a day (QID) | INTRAMUSCULAR | Status: DC | PRN
  Administered 2011-12-14: 4 mg via INTRAVENOUS
  Filled 2011-12-13: qty 2

## 2011-12-13 MED ORDER — BUTORPHANOL TARTRATE 2 MG/ML IJ SOLN
1.0000 mg | Freq: Once | INTRAMUSCULAR | Status: AC
  Administered 2011-12-13: 1 mg via INTRAVENOUS
  Filled 2011-12-13: qty 1

## 2011-12-13 NOTE — Progress Notes (Signed)
  Subjective: Patient doing well.  Objective: BP 124/62  Pulse 76  Temp 98.1 F (36.7 C)  Resp 16  Ht 5\' 3"  (1.6 m)  Wt 64.411 kg (142 lb)  BMI 25.15 kg/m2      FHT:  FHR: 135 bpm, variability: moderate,  accelerations:  Present,  decelerations:  Present variable UC:   irregular, every 5 minutes SVE:   Dilation: 1 Effacement (%): 80 Station: -1 Exam by:: Heyward Douthit  Labs: Lab Results  Component Value Date   WBC 8.9 12/13/2011   HGB 12.0 12/13/2011   HCT 36.0 12/13/2011   MCV 91.4 12/13/2011   PLT 260 12/13/2011    Assessment / Plan: Category 2 tracing.  Foley balloon placed.  Start basal pitocin.  Fentanyl for pain medicine.  [place] Urieta JEHIEL 12/13/2011, 5:42 PM

## 2011-12-13 NOTE — Anesthesia Preprocedure Evaluation (Signed)

## 2011-12-13 NOTE — Progress Notes (Signed)
   Subjective: Pt reports increase in pain with contractions.  Desires epidural.    Objective: BP 119/66  Pulse 90  Temp 98 F (36.7 C) (Oral)  Resp 18  Ht 5\' 3"  (1.6 m)  Wt 64.411 kg (142 lb)  BMI 25.15 kg/m2      FHT:  FHR: 120's bpm, variability: moderate,  accelerations:  Present,  decelerations:  Absent UC:   regular, every 2-3 minutes SVE:   Dilation: 5.5 Effacement (%): 80 Station: 0 Exam by:: Roney Marion, CNM  Labs: Lab Results  Component Value Date   WBC 8.9 12/13/2011   HGB 12.0 12/13/2011   HCT 36.0 12/13/2011   MCV 91.4 12/13/2011   PLT 260 12/13/2011    Assessment / Plan: Augmentation of labor, progressing well  Labor: Progressing normally Preeclampsia:  n/a Fetal Wellbeing:  Category I Pain Control:  Fentanyl I/D:  n/a Anticipated MOD:  NSVD Pt may have epidural  [facility] 12/13/2011, 10:38 PM

## 2011-12-13 NOTE — H&P (Signed)
[name] is a 31 y.o. female presenting for IOL due to postdates. History This is a 31yo G2P0010 at 41.3 weeks.  She is late transition of care as she moved to [place] from [place].  No other complications of pregnancy.  Mild contractions.  Denies decreased fetal activity, vaginal bleeding, vaginal discharge.  OB History    Grav Para Term Preterm Abortions TAB SAB Ect Mult Living   2    1          Past Medical History  Diagnosis Date  . No pertinent past medical history    Past Surgical History  Procedure Date  . Induced abortion    Family History: family history is negative for Anesthesia problems, and Hypotension, and Malignant hyperthermia, and Pseudochol deficiency, . Social History:  reports that she has never smoked. She has never used smokeless tobacco. She reports that she does not drink alcohol or use illicit drugs.   Prenatal Transfer Tool  Maternal Diabetes: No Genetic Screening: Declined Maternal Ultrasounds/Referrals: Normal Fetal Ultrasounds or other Referrals:  None Maternal Substance Abuse:  No Significant Maternal Medications:  None Significant Maternal Lab Results:  None Other Comments:  None  ROS    Blood pressure 121/70, pulse 82, temperature 98.1 F (36.7 C), resp. rate 16, height 5\' 3"  (1.6 m), weight 64.411 kg (142 lb).   Fetal Exam Fetal Monitor Review: Mode: hand-held doppler probe.   Baseline rate: 135.  Variability: moderate (6-25 bpm).   Pattern: accelerations present and no decelerations.    Fetal State Assessment: Category I - tracings are normal.     Physical Exam  Constitutional: She is oriented to person, place, and time. She appears well-developed and well-nourished.  GI: Soft. She exhibits no distension and no mass. There is no tenderness. There is no rebound and no guarding.  Neurological: She is alert and oriented to person, place, and time.  Skin: Skin is warm and dry. No rash noted. No erythema. No pallor.  Psychiatric:  She has a normal mood and affect. Her behavior is normal. Judgment and thought content normal.    Prenatal labs: ABO, Rh: B/Positive/-- (11/19 0000) Antibody:   Rubella: >500.0 (05/29 0944) RPR: Nonreactive (11/19 0000)  HBsAg: Negative (11/19 0000)  HIV: NON REACTIVE (05/29 0944)  GBS: Negative (05/29 0000)   Assessment/Plan: 1.  IUP at 41.3 weeks 2.  GBS neg 3.  IOL for postdates.  Cytotec induction.  Patient wishes to bottle-feed.  Teaching service for pediatrics.  Uncertain birth control following delivery.   Honestii Marton JEHIEL 12/13/2011, 9:04 AM

## 2011-12-13 NOTE — Anesthesia Procedure Notes (Signed)
Epidural Patient location during procedure: OB Start time: 12/13/2011 11:10 PM Reason for block: procedure for pain  Staffing Performed by: anesthesiologist   Preanesthetic Checklist Completed: patient identified, site marked, surgical consent, pre-op evaluation, timeout performed, IV checked, risks and benefits discussed and monitors and equipment checked  Epidural Patient position: sitting Prep: site prepped and draped and DuraPrep Patient monitoring: continuous pulse ox and blood pressure Approach: midline Injection technique: LOR air  Needle:  Needle type: Tuohy  Needle gauge: 17 G Needle length: 9 cm Needle insertion depth: 4 cm Catheter type: closed end flexible Catheter size: 19 Gauge Catheter at skin depth: 9 cm Test dose: negative  Assessment Events: blood not aspirated, injection not painful, no injection resistance, negative IV test and no paresthesia  Additional Notes Discussed risk of headache, infection, bleeding, nerve injury and failed or incomplete block.  Patient voices understanding and wishes to proceed.

## 2011-12-14 ENCOUNTER — Encounter (HOSPITAL_COMMUNITY): Payer: Self-pay

## 2011-12-14 DIAGNOSIS — O480 Post-term pregnancy: Secondary | ICD-10-CM

## 2011-12-14 MED ORDER — IBUPROFEN 600 MG PO TABS
600.0000 mg | ORAL_TABLET | Freq: Four times a day (QID) | ORAL | Status: DC
  Administered 2011-12-14 – 2011-12-16 (×8): 600 mg via ORAL
  Filled 2011-12-14 (×8): qty 1

## 2011-12-14 MED ORDER — BENZOCAINE-MENTHOL 20-0.5 % EX AERO
1.0000 "application " | INHALATION_SPRAY | CUTANEOUS | Status: DC | PRN
  Administered 2011-12-14: 1 via TOPICAL
  Filled 2011-12-14 (×2): qty 56

## 2011-12-14 MED ORDER — TETANUS-DIPHTH-ACELL PERTUSSIS 5-2.5-18.5 LF-MCG/0.5 IM SUSP
0.5000 mL | Freq: Once | INTRAMUSCULAR | Status: DC
  Filled 2011-12-14: qty 0.5

## 2011-12-14 MED ORDER — LANOLIN HYDROUS EX OINT: TOPICAL_OINTMENT | CUTANEOUS | Status: DC | PRN

## 2011-12-14 MED ORDER — DIBUCAINE 1 % RE OINT: 1.0000 "application " | TOPICAL_OINTMENT | RECTAL | Status: DC | PRN

## 2011-12-14 MED ORDER — OXYCODONE-ACETAMINOPHEN 5-325 MG PO TABS
1.0000 | ORAL_TABLET | ORAL | Status: DC | PRN
  Administered 2011-12-14: 2 via ORAL
  Administered 2011-12-15 – 2011-12-16 (×3): 1 via ORAL
  Filled 2011-12-14 (×3): qty 1
  Filled 2011-12-14: qty 2

## 2011-12-14 MED ORDER — SENNOSIDES-DOCUSATE SODIUM 8.6-50 MG PO TABS
2.0000 | ORAL_TABLET | Freq: Every day | ORAL | Status: DC
  Administered 2011-12-14 – 2011-12-15 (×2): 2 via ORAL

## 2011-12-14 MED ORDER — PRENATAL MULTIVITAMIN CH
1.0000 | ORAL_TABLET | Freq: Every day | ORAL | Status: DC
  Administered 2011-12-15 – 2011-12-16 (×2): 1 via ORAL
  Filled 2011-12-14 (×2): qty 1

## 2011-12-14 MED ORDER — ONDANSETRON HCL 4 MG PO TABS: 4.0000 mg | ORAL_TABLET | ORAL | Status: DC | PRN

## 2011-12-14 MED ORDER — SIMETHICONE 80 MG PO CHEW: 80.0000 mg | CHEWABLE_TABLET | ORAL | Status: DC | PRN

## 2011-12-14 MED ORDER — ZOLPIDEM TARTRATE 5 MG PO TABS: 5.0000 mg | ORAL_TABLET | Freq: Every evening | ORAL | Status: DC | PRN

## 2011-12-14 MED ORDER — DIPHENHYDRAMINE HCL 25 MG PO CAPS: 25.0000 mg | ORAL_CAPSULE | Freq: Four times a day (QID) | ORAL | Status: DC | PRN

## 2011-12-14 MED ORDER — ONDANSETRON HCL 4 MG/2ML IJ SOLN: 4.0000 mg | INTRAMUSCULAR | Status: DC | PRN

## 2011-12-14 MED ORDER — WITCH HAZEL-GLYCERIN EX PADS: 1.0000 "application " | MEDICATED_PAD | CUTANEOUS | Status: DC | PRN

## 2011-12-14 NOTE — Progress Notes (Signed)
[name] contacted Dr. Malen Gauze about lowering epidural dose. Dr. Malen Gauze gave orders to decrease epidural dose to 28mL/hr. Epidural settings changed to 12mL/hr per order by H. Clovis Riley, Charity fundraiser. [name], RN

## 2011-12-14 NOTE — Progress Notes (Signed)
Attempted to push, pt feeling no pressure and having no urge to push at this time, will allow pt to rest and reevaluate

## 2011-12-14 NOTE — Progress Notes (Signed)
Pt vomited small amount liquid emesis, will medicate for nausea

## 2011-12-14 NOTE — Progress Notes (Signed)
   Subjective: Pt comfortable after epidural.  Objective: BP 135/72  Pulse 80  Temp 98 F (36.7 C) (Oral)  Resp 20  Ht 5\' 3"  (1.6 m)  Wt 64.411 kg (142 lb)  BMI 25.15 kg/m2  SpO2 100%      FHT:  FHR: 120's bpm, variability: moderate,  accelerations:  Present,  decelerations:  Present variables UC:   irregular, every 2-5 minutes SVE:   Dilation: 10 Effacement (%): 90 Station: +1 Exam by:: Danella Deis, RNC-OB, C-EFM  Labs: Lab Results  Component Value Date   WBC 8.9 12/13/2011   HGB 12.0 12/13/2011   HCT 36.0 12/13/2011   MCV 91.4 12/13/2011   PLT 260 12/13/2011    Assessment / Plan: Augmentation of labor, progressing well  Labor: Progressing normally Preeclampsia:  n/a Fetal Wellbeing:  Category II Pain Control:  Epidural I/D:  n/a Anticipated MOD:  NSVD  MUHAMMAD,Jackquelyn Sundberg 12/14/2011, 2:04 AM

## 2011-12-14 NOTE — Progress Notes (Signed)
[name] is a 31 y.o. G2P0010 at [redacted]w[redacted]d admitted for induction of labor due to Post dates. Due date 12/03/11.  Subjective: Pt pushing well with RN at this time.   Objective: BP 130/63  Pulse 81  Temp 98.5 F (36.9 C) (Oral)  Resp 20  Ht 5\' 3"  (1.6 m)  Wt 64.411 kg (142 lb)  BMI 25.15 kg/m2  SpO2 100% I/O last 3 completed shifts: In: -  Out: 900 [Urine:900]    FHT:  FHR: 140 bpm, variability: moderate,  accelerations:  Present,  decelerations:  Present variables with pushing UC:   regular, every 3 minutes SVE:   Dilation: 10 Effacement (%): 100 Station: +2 Exam by:: Danella Deis, RNC-OB, C-EFM  Labs: Lab Results  Component Value Date   WBC 8.9 12/13/2011   HGB 12.0 12/13/2011   HCT 36.0 12/13/2011   MCV 91.4 12/13/2011   PLT 260 12/13/2011    Assessment / Plan: Induction of labor, progressing normally Discussed use of vacuum by Dr Emelda Fear with pt, risks/benefits Discussed turning epidural off if needed with pt, epidural remains on at this time  Labor: Progressing normally Preeclampsia:  n/a Fetal Wellbeing:  Category II Pain Control:  Epidural I/D:  n/a Anticipated MOD:  NSVD  [name], [name] 12/14/2011, 8:52 AM

## 2011-12-14 NOTE — Progress Notes (Signed)
Warren Lacy, CNM at bedside, pushing with pt, encouragement given, agrees to allow pt 15 min rest period, then resume pushing

## 2011-12-15 LAB — TYPE AND SCREEN
ABO/RH(D): B POS
Unit division: 0

## 2011-12-15 LAB — CBC
HCT: 27.8 % — ABNORMAL LOW (ref 36.0–46.0)
Hemoglobin: 9.3 g/dL — ABNORMAL LOW (ref 12.0–15.0)
MCH: 30.5 pg (ref 26.0–34.0)
MCV: 91.1 fL (ref 78.0–100.0)
RBC: 3.05 MIL/uL — ABNORMAL LOW (ref 3.87–5.11)

## 2011-12-15 NOTE — Progress Notes (Signed)
Ur chart review completed.  

## 2011-12-15 NOTE — Progress Notes (Signed)
Post Partum Day #1 Subjective: up ad lib; some perineal pain; bottlefeeding but would like to breastfeed  Objective: Blood pressure 113/69, pulse 86, temperature 98.1 F (36.7 C), temperature source Oral, resp. rate 18, height 5\' 3"  (1.6 m), weight 64.411 kg (142 lb), SpO2 100.00%, unknown if currently breastfeeding.  Physical Exam:  General: alert, cooperative and mild distress Lochia: appropriate Uterine Fundus: firm DVT Evaluation: No evidence of DVT seen on physical exam.   Basename 12/15/11 0535 12/13/11 0805  HGB 9.3* 12.0  HCT 27.8* 36.0    Assessment/Plan: Plan for discharge tomorrow and Lactation consult   LOS: 2 days   Cam Hai 12/15/2011, 7:53 AM

## 2011-12-16 MED ORDER — IBUPROFEN 600 MG PO TABS: 600.0000 mg | ORAL_TABLET | Freq: Four times a day (QID) | ORAL | Status: AC

## 2011-12-16 MED ORDER — SENNOSIDES-DOCUSATE SODIUM 8.6-50 MG PO TABS: 2.0000 | ORAL_TABLET | Freq: Every day | ORAL | Status: DC

## 2011-12-16 NOTE — Discharge Summary (Signed)
Obstetric Discharge Summary Reason for Admission: induction of labor due to post dates. Intrapartum Procedures: vacuum Postpartum Procedures: 3rd degree lac repair Complications-Operative and Postpartum: perineal laceration 3rd degree  Pt up ad lib, notes improvement in perineal pain and bleeding. Denies cramping. Met with lac consultant yesterday which was helpful; breast and bottle feeding. Pt does not desire contraception at this time as husband is in [place] and will not be back in the states for 7 months.  Hemoglobin  Date Value Range Status  12/15/2011 9.3* 12.0 - 15.0 g/dL Final     DELTA CHECK NOTED     REPEATED TO VERIFY     HCT  Date Value Range Status  12/15/2011 27.8* 36.0 - 46.0 % Final    Physical Exam:  General: alert, cooperative and no distress Cardiac: RRR, no M/G/R Respiratory: vesicular sounds throughout, no increased respiratory effort Lochia: appropriate Uterine Fundus: firm DVT Evaluation: No evidence of DVT seen on physical exam. Negative Homan's sign. Calf/Ankle edema is present.  Discharge Diagnoses: Term Pregnancy-delivered  Discharge Information: Date: 12/16/2011 Activity: unrestricted Diet: routine Medications: Ibuprofen and Percocet Condition: stable Instructions: refer to practice specific booklet Discharge to: home   Newborn Data: Live born female  Birth Weight: 7 lb 9.5 oz (3445 g) APGAR: 8, 9  Home with mother.  Latina Craver 12/16/2011, 7:43 AM  I have seen and examined the patient. Agree with above assessment and plan. Follow-up appointment in [facility] hospital clinic in 4-6 weeks

## 2011-12-16 NOTE — Anesthesia Postprocedure Evaluation (Signed)
Patient stable following vaginal delivery.  

## 2011-12-18 ENCOUNTER — Inpatient Hospital Stay (HOSPITAL_COMMUNITY)
Admission: AD | Admit: 2011-12-18 | Discharge: 2011-12-18 | Disposition: A | Discharge status: Home / Self Care | Payer: Medicaid Other | Source: Ambulatory Visit | Attending: Obstetrics & Gynecology | Admitting: Obstetrics & Gynecology

## 2011-12-18 ENCOUNTER — Encounter (HOSPITAL_COMMUNITY): Payer: Self-pay

## 2011-12-18 DIAGNOSIS — N949 Unspecified condition associated with female genital organs and menstrual cycle: Secondary | ICD-10-CM | POA: Insufficient documentation

## 2011-12-18 DIAGNOSIS — IMO0002 Reserved for concepts with insufficient information to code with codable children: Secondary | ICD-10-CM | POA: Insufficient documentation

## 2011-12-18 DIAGNOSIS — R609 Edema, unspecified: Secondary | ICD-10-CM

## 2011-12-18 LAB — COMPREHENSIVE METABOLIC PANEL
AST: 22 U/L (ref 0–37)
BUN: 5 mg/dL — ABNORMAL LOW (ref 6–23)
CO2: 26 mEq/L (ref 19–32)
Calcium: 8.6 mg/dL (ref 8.4–10.5)
Chloride: 110 mEq/L (ref 96–112)
Creatinine, Ser: 0.49 mg/dL — ABNORMAL LOW (ref 0.50–1.10)
GFR calc Af Amer: >90 mL/min (ref >90)
GFR calc non Af Amer: >90 mL/min (ref >90)
Total Bilirubin: 0.2 mg/dL — ABNORMAL LOW (ref 0.3–1.2)

## 2011-12-18 LAB — URINE MICROSCOPIC-ADD ON

## 2011-12-18 LAB — URINALYSIS, ROUTINE W REFLEX MICROSCOPIC
Bilirubin Urine: NEGATIVE
Glucose, UA: NEGATIVE mg/dL
Ketones, ur: NEGATIVE mg/dL
Protein, ur: NEGATIVE mg/dL

## 2011-12-18 LAB — CBC
Hemoglobin: 10.1 g/dL — ABNORMAL LOW (ref 12.0–15.0)
MCH: 30.6 pg (ref 26.0–34.0)
Platelets: 284 10*3/uL (ref 150–400)
RBC: 3.3 MIL/uL — ABNORMAL LOW (ref 3.87–5.11)
WBC: 10.2 10*3/uL (ref 4.0–10.5)

## 2011-12-18 MED ORDER — LIDOCAINE HCL 2 % EX GEL: Freq: Once | CUTANEOUS | Status: DC

## 2011-12-18 MED ORDER — BENZOCAINE-MENTHOL 20-0.5 % EX AERO: 1.0000 "application " | INHALATION_SPRAY | Freq: Four times a day (QID) | CUTANEOUS | Status: DC | PRN

## 2011-12-18 MED ORDER — OXYCODONE-ACETAMINOPHEN 5-325 MG PO TABS
1.0000 | ORAL_TABLET | Freq: Once | ORAL | Status: AC
  Administered 2011-12-18: 1 via ORAL
  Filled 2011-12-18: qty 1

## 2011-12-18 MED ORDER — OXYCODONE-ACETAMINOPHEN 5-325 MG PO TABS: 1.0000 | ORAL_TABLET | ORAL | Status: DC | PRN

## 2011-12-18 NOTE — MAU Note (Signed)
Patient is in with c/o increase lower extremity swelling (4+ edema) and intense vaginal area pain that prevents her from sitting per patient. She states that she had laceration during delivery (she delivered 7/1). She is breastfeeding. She denies any headache, visual disturbance or epigastric. She states that she took 600mg  ibuprophen at 10.20am.

## 2011-12-18 NOTE — MAU Provider Note (Signed)
History     CSN: [id number]  Arrival date and time: 12/18/11 1134  Chief Complaint  Patient presents with  . Leg Swelling  . Vaginal Pain   HPI: [name] is a 31 year old G3P1021 who is 4 days post-partum after a vacuum-assisted vaginal delivery with a 3rd-degree perineal laceration on 12/14/11.  She had significant perineal pain and some edema during her postpartum stay, but it was improving.  She has had continued perineal pain even with taking the ibuprofen and wonders why it is still hurting.  She denies fever, purulent drainage, and heavy vaginal bleeding (now it is more like a period).  She is very concerned about her edema.  She has been lying in bed quite a bit the last few days because of pain and fatigue.  OB History    Grav Para Term Preterm Abortions TAB SAB Ect Mult Living   3 1 1  2 1    1       Past Medical History  Diagnosis Date  . No pertinent past medical history     Past Surgical History  Procedure Date  . Induced abortion     Family History  Problem Relation Age of Onset  . Anesthesia problems Neg Hx   . Hypotension Neg Hx   . Malignant hyperthermia Neg Hx   . Pseudochol deficiency Neg Hx   Kidney disease: none DVT/PE: none  History  Substance Use Topics  . Smoking status: Never Smoker   . Smokeless tobacco: Never Used  . Alcohol Use: No    Allergies: No Known Allergies  No prescriptions prior to admission   She has been taking mostly ibuprofen for pain.  She did not get an Rx for Percocet.  Review of Systems  Constitutional: Negative for fever, chills and malaise/fatigue.  Eyes: Negative for blurred vision.  Respiratory: Negative for cough and shortness of breath.   Cardiovascular: Positive for leg swelling. Negative for chest pain, palpitations, orthopnea and PND.  Gastrointestinal: Negative for nausea, vomiting and abdominal pain.  Genitourinary: Positive for dysuria. Negative for urgency and frequency.  Skin: Negative for itching and  rash.  Neurological: Negative for dizziness, tingling and headaches.  Psychiatric/Behavioral: The patient is nervous/anxious.    Physical Exam   Blood pressure 143/81, pulse 55, temperature 97.8 F (36.6 C), temperature source Oral, resp. rate 18, weight 142 lb 6.4 oz (64.592 kg), SpO2 100.00%, unknown if currently breastfeeding.   Physical Exam  Constitutional: She appears well-developed and well-nourished. No distress.  HENT:  Head: Normocephalic and atraumatic.  Eyes: Conjunctivae are normal.  Neck: Normal range of motion. Neck supple.  Cardiovascular: Normal rate, regular rhythm and normal heart sounds.   Respiratory: Effort normal and breath sounds normal.  GI: Soft. Bowel sounds are normal. She exhibits no distension. There is no tenderness. There is no rebound and no guarding.  Genitourinary: There is tenderness around the vagina. There are signs of injury (Perineum is slightly edematous and very tender to palpation. Laceration is healing but still raw. No purulent drainage. Mild bleeding seen.) around the vagina. No vaginal discharge found.  Musculoskeletal: She exhibits edema (3+ pitting edema of both feet, 2+ to mid-shins bilaterally).  Skin: She is not diaphoretic.    MAU Course  Procedures  CMP     Component Value Date/Time   NA 141 12/18/2011 1300   K 3.8 12/18/2011 1300   CL 110 12/18/2011 1300   CO2 26 12/18/2011 1300   GLUCOSE 84 12/18/2011 1300   BUN  5* 12/18/2011 1300   CREATININE 0.49* 12/18/2011 1300   CALCIUM 8.6 12/18/2011 1300   PROT 5.6* 12/18/2011 1300   ALBUMIN 2.5* 12/18/2011 1300   AST 22 12/18/2011 1300   ALT 30 12/18/2011 1300   ALKPHOS 121* 12/18/2011 1300   BILITOT 0.2* 12/18/2011 1300   GFRNONAA >90 12/18/2011 1300   GFRAA >90 12/18/2011 1300   CBC    Component Value Date/Time   WBC 10.2 12/18/2011 1300   RBC 3.30* 12/18/2011 1300   HGB 10.1* 12/18/2011 1300   HCT 30.3* 12/18/2011 1300   PLT 284 12/18/2011 1300   MCV 91.8 12/18/2011 1300   MCH 30.6 12/18/2011 1300   MCHC  33.3 12/18/2011 1300   RDW 14.1 12/18/2011 1300    Urinalysis    Component Value Date/Time   COLORURINE YELLOW 12/18/2011 1200   APPEARANCEUR CLEAR 12/18/2011 1200   LABSPEC <1.005* 12/18/2011 1200   PHURINE 6.0 12/18/2011 1200   GLUCOSEU NEGATIVE 12/18/2011 1200   HGBUR LARGE* 12/18/2011 1200   BILIRUBINUR NEGATIVE 12/18/2011 1200   KETONESUR NEGATIVE 12/18/2011 1200   PROTEINUR NEGATIVE 12/18/2011 1200   UROBILINOGEN 0.2 12/18/2011 1200   NITRITE NEGATIVE 12/18/2011 1200   LEUKOCYTESUR SMALL* 12/18/2011 1200       Assessment and Plan  1. Edema: No proteinuria, no sign/symptom of pre-eclampsia as LFTs, platelets are nml and she has no HA, blurry vision.  SBP is up a little but she is in a good amount of pain due to her perineal laceration.  Very unlikely to be bilateral DVTs as no erythema or tenderness of legs.  Will continue to monitor, asked her to make a f/u at [facility] clinic sooner than her PP visit if her edema did not improve with leg elevation, return to ambulation.  Likely normal variant in postpartum period.  2. Perineal pain: Expected after 3rd degree laceration with repair. No evidence of infection currently. Will Rx Percocet and Dermoplast spray, which she plans to pick up today. She got a Percocet here and had almost immediate relief of her pain. She is going to hold off on taking the ibuprofen until her swelling resolves as this could also be contributing.  Discharge home.  All of her questions were answered.  Billie Intriago E. 12/18/2011, 9:55 PM

## 2011-12-18 NOTE — MAU Note (Signed)
Patient states she had a SVD on 6-30. Had swelling in feet and legs prior to delivery but state it is getting worse. Has vaginal pain and soreness.

## 2011-12-19 NOTE — MAU Provider Note (Signed)
Medical Screening exam and patient care preformed by advanced practice provider.  Agree with the above management.  

## 2011-12-22 ENCOUNTER — Encounter (HOSPITAL_COMMUNITY): Payer: Self-pay | Admitting: *Deleted

## 2011-12-22 ENCOUNTER — Inpatient Hospital Stay (HOSPITAL_COMMUNITY)
Admission: AD | Admit: 2011-12-22 | Discharge: 2011-12-23 | Disposition: A | Discharge status: Home / Self Care | Payer: Medicaid Other | Source: Ambulatory Visit | Attending: Obstetrics & Gynecology | Admitting: Obstetrics & Gynecology

## 2011-12-22 DIAGNOSIS — R109 Unspecified abdominal pain: Secondary | ICD-10-CM | POA: Insufficient documentation

## 2011-12-22 DIAGNOSIS — O99893 Other specified diseases and conditions complicating puerperium: Secondary | ICD-10-CM | POA: Insufficient documentation

## 2011-12-22 DIAGNOSIS — R112 Nausea with vomiting, unspecified: Secondary | ICD-10-CM | POA: Diagnosis present

## 2011-12-22 NOTE — MAU Note (Signed)
Pt delivered last week on June 30th SVD.  Pt said she ate some wheat porridge @ 2000 tonight and then she vomited.  She says she feels better since she vomited, but her stomache is still uncomfortable.

## 2011-12-23 DIAGNOSIS — R112 Nausea with vomiting, unspecified: Secondary | ICD-10-CM

## 2011-12-23 LAB — COMPREHENSIVE METABOLIC PANEL
AST: 14 U/L (ref 0–37)
Albumin: 2.9 g/dL — ABNORMAL LOW (ref 3.5–5.2)
Alkaline Phosphatase: 112 U/L (ref 39–117)
BUN: 6 mg/dL (ref 6–23)
Chloride: 105 mEq/L (ref 96–112)
Potassium: 3.4 mEq/L — ABNORMAL LOW (ref 3.5–5.1)
Sodium: 139 mEq/L (ref 135–145)
Total Bilirubin: 0.2 mg/dL — ABNORMAL LOW (ref 0.3–1.2)
Total Protein: 6.1 g/dL (ref 6.0–8.3)

## 2011-12-23 LAB — CBC
HCT: 33.4 % — ABNORMAL LOW (ref 36.0–46.0)
MCHC: 32.9 g/dL (ref 30.0–36.0)
Platelets: 385 10*3/uL (ref 150–400)
RDW: 13.9 % (ref 11.5–15.5)
WBC: 16.5 10*3/uL — ABNORMAL HIGH (ref 4.0–10.5)

## 2011-12-23 MED ORDER — PROMETHAZINE HCL 12.5 MG PO TABS: 12.5000 mg | ORAL_TABLET | Freq: Four times a day (QID) | ORAL | Status: DC | PRN

## 2011-12-23 NOTE — MAU Provider Note (Signed)
Attestation of Attending Supervision of Advanced Practitioner (CNM/NP): Evaluation and management procedures were performed by the Advanced Practitioner under my supervision and collaboration.  I have reviewed the Advanced Practitioner's note and chart, and I agree with the management and plan.  If patient returns, she will also need a catherized (to avoid lochia) urine sample for Pr/Cr ratio. Needs BP recheck in clinic in a few days.  Inbasket message sent to clinic to schedule BP check.  Jaynie Collins, M.D. 12/23/2011 4:50 AM

## 2011-12-23 NOTE — MAU Provider Note (Signed)
History     CSN: [id number]  Arrival date and time: 12/22/11 2224   First Provider Initiated Contact with Patient 12/22/11 2334      No chief complaint on file.  HPI  31 yo female G3P1021 PPD #9 from [facility] with 3rd degree lac who presents following single episode of vomiting this evening after eating wheat porridge. Pt states she was breastfeeding her son and eating, and sat for an hour before getting up. Upon standing, she developed lower abdominal cramping followed by nausea and non-bilious, non-blood vomiting episode. Pt experienced immediate relief after vomiting. Pt reports normal appetite, no nausea or vomiting, or abdominal pain earlier in the day. She has had wheat porridge one other time since delivery and did not experience similar problems; food was not expired. Normal stools. She denies all symptoms currently. She denies headache, dizziness, visual changes.  Pt continues to have some bleeding secondary to 3rd degree laceration but this is improving and she is not saturating pads.  Pt also requesting information regarding where she can have her baby circumcised.   OB History    Grav Para Term Preterm Abortions TAB SAB Ect Mult Living   3 1 1  2 1    1       Past Medical History  Diagnosis Date  . No pertinent past medical history     Past Surgical History  Procedure Date  . Induced abortion     Family History  Problem Relation Age of Onset  . Anesthesia problems Neg Hx   . Hypotension Neg Hx   . Malignant hyperthermia Neg Hx   . Pseudochol deficiency Neg Hx     History  Substance Use Topics  . Smoking status: Never Smoker   . Smokeless tobacco: Never Used  . Alcohol Use: No    Allergies: No Known Allergies  Prescriptions prior to admission  Medication Sig Dispense Refill  . benzocaine-Menthol (DERMOPLAST) 20-0.5 % AERO Apply 1 application topically 4 (four) times daily as needed (vaginal pain).  1 each  1  . ibuprofen (ADVIL,MOTRIN) 600 MG tablet Take 1  tablet (600 mg total) by mouth every 6 (six) hours.  30 tablet  2  . oxyCODONE-acetaminophen (ROXICET) 5-325 MG per tablet Take 1 tablet by mouth every 4 (four) hours as needed for pain.  20 tablet  0  . Prenatal Vit-Fe Fumarate-FA (PRENATAL MULTIVITAMIN) TABS Take 1 tablet by mouth at bedtime.      . senna-docusate (SENOKOT-S) 8.6-50 MG per tablet Take 2 tablets by mouth at bedtime.  60 tablet  2    Review of Systems  Constitutional: Negative for fever, chills, malaise/fatigue and diaphoresis.  Eyes: Negative for blurred vision and double vision.  Respiratory: Negative for cough and shortness of breath.   Cardiovascular: Negative for chest pain, palpitations and leg swelling.  Gastrointestinal: Positive for vomiting. Negative for heartburn, nausea, abdominal pain, diarrhea, constipation, blood in stool and melena.  Genitourinary: Negative for dysuria, urgency, frequency, hematuria and flank pain.  Skin: Negative for rash.  Neurological: Negative for dizziness, weakness and headaches.  Psychiatric/Behavioral: The patient is not nervous/anxious.    Physical Exam   Blood pressure 150/80, pulse 71, temperature 98.6 F (37 C), temperature source Oral, resp. rate 16, weight 59.875 kg (132 lb), currently breastfeeding. Recheck BP 144/81  Patient Vitals for the past 24 hrs:  BP Temp Temp src Pulse Resp Weight  12/23/11 0103 138/66 mmHg - - 66  18  -  12/23/11 0046 138/66 mmHg - -  66  - -  12/23/11 0031 146/75 mmHg - - 65  - -  12/23/11 0015 148/76 mmHg - - 65  - -  12/22/11 2233 150/80 mmHg 98.6 F (37 C) Oral 71  16  59.875 kg (132 lb)   Physical Exam  Constitutional: She is oriented to person, place, and time. She appears well-developed. No distress.  HENT:  Head: Normocephalic and atraumatic.  Mouth/Throat: Oropharynx is clear and moist. No oropharyngeal exudate.  Eyes: Pupils are equal, round, and reactive to light.  Neck: Normal range of motion.  Cardiovascular: Normal rate,  regular rhythm, normal heart sounds and intact distal pulses.  Exam reveals no gallop and no friction rub.   No murmur heard. Respiratory: Effort normal and breath sounds normal. No respiratory distress.  GI: Soft. Bowel sounds are normal. She exhibits no distension. There is tenderness (mild diffuse).  Musculoskeletal: She exhibits edema (bilateral 1+).  Neurological: She is alert and oriented to person, place, and time.  Skin: Skin is warm and dry. She is not diaphoretic.  Psychiatric: She has a normal mood and affect. Her behavior is normal. Judgment and thought content normal.    MAU Course  Procedures  Initial differential: post-partum preeclampsia, GI upset, reflux  MDM CBC and CMP Recheck BP  CBC    Component Value Date/Time   WBC 16.5* 12/23/2011 0015   RBC 3.68* 12/23/2011 0015   HGB 11.0* 12/23/2011 0015   HCT 33.4* 12/23/2011 0015   PLT 385 12/23/2011 0015   MCV 90.8 12/23/2011 0015   MCH 29.9 12/23/2011 0015   MCHC 32.9 12/23/2011 0015   RDW 13.9 12/23/2011 0015   CMP     Component Value Date/Time   NA 139 12/23/2011 0015   K 3.4* 12/23/2011 0015   CL 105 12/23/2011 0015   CO2 24 12/23/2011 0015   GLUCOSE 98 12/23/2011 0015   BUN 6 12/23/2011 0015   CREATININE 0.43* 12/23/2011 0015   CALCIUM 9.0 12/23/2011 0015   PROT 6.1 12/23/2011 0015   ALBUMIN 2.9* 12/23/2011 0015   AST 14 12/23/2011 0015   ALT 21 12/23/2011 0015   ALKPHOS 112 12/23/2011 0015   BILITOT 0.2* 12/23/2011 0015   GFRNONAA >90 12/23/2011 0015   GFRAA >90 12/23/2011 0015    Assessment and Plan  1. Nausea/vomiting 2. Post-partum education  1. Will provide pt with prescription for Phenergan; likely isolated episode. 2. Provided post-partum education regarding bleeding. Also provided pt with information regarding circumcision options. 3. Preeclampsia precautions given 4. Keep scheduled postpartum appointment at clinic 5. Return to MAU as needed  [name] 12/23/2011, 12:02 AM   I have seen this patient and agree with the above  PA student's note.  [name], LISA Certified Nurse-Midwife

## 2011-12-24 ENCOUNTER — Telehealth: Payer: Self-pay | Admitting: *Deleted

## 2011-12-24 NOTE — Telephone Encounter (Signed)
Message copied by Mannie Stabile on Wed Dec 24, 2011  8:39 AM ------      Message from: Jaynie Collins A      Created: Tue Dec 23, 2011  4:50 AM      Regarding: Needs BP check       Please have patient come in for postpartum BP check on Wednesday or Thursday this week.  If BP >140/90s, please notify attending on call.  Was seen in MAU 7/9 and had elevated BP, normal labs.  Needs urine Pr/Cr ratio if BP remains elevated and further evaluation/management.  Thanks!            [facility]

## 2011-12-24 NOTE — Telephone Encounter (Signed)
Pt coming in at 3pm on Thursday July 11

## 2011-12-25 ENCOUNTER — Ambulatory Visit (INDEPENDENT_AMBULATORY_CARE_PROVIDER_SITE_OTHER): Payer: Medicaid Other | Admitting: General Practice

## 2011-12-25 VITALS — BP 116/78 | HR 84 | Ht 63.0 in | Wt 127.0 lb

## 2011-12-25 DIAGNOSIS — O1093 Unspecified pre-existing hypertension complicating the puerperium: Secondary | ICD-10-CM

## 2011-12-25 DIAGNOSIS — Z0130 Encounter for examination of blood pressure without abnormal findings: Secondary | ICD-10-CM

## 2011-12-25 NOTE — Progress Notes (Signed)
Patient came in for blood pressure check; BP was 116/78  p-84, per Dr. Macon Large patient's blood pressure is fine

## 2012-01-16 ENCOUNTER — Encounter: Payer: Self-pay | Admitting: Advanced Practice Midwife

## 2012-01-16 ENCOUNTER — Ambulatory Visit: Payer: Medicaid Other | Admitting: Advanced Practice Midwife

## 2012-01-16 NOTE — Patient Instructions (Signed)

## 2012-01-16 NOTE — Progress Notes (Unsigned)
  Subjective:     [name] is a 31 y.o. female who presents for a postpartum visit. She is 5 weeks postpartum following a spontaneous vaginal delivery. I have fully reviewed the prenatal and intrapartum course. The delivery was at 41.4 gestational weeks. Outcome: spontaneous vaginal delivery. Anesthesia: regional. Postpartum course has been uneventful except for some constipation. Baby's course has been uneventful. Baby is feeding by both breast and bottle - . Bleeding staining only and brown. Bowel function is abnormal: constipation. Bladder function is normal. Patient is not sexually active. Contraception method is none/abstinence. Postpartum depression screening: negative.  The following portions of the patient's history were reviewed and updated as appropriate: allergies, current medications, past family history, past medical history, past social history, past surgical history and problem list.  Review of Systems Pertinent items are noted in HPI.   Objective:    BP 103/66  Pulse 81  Temp 97 F (36.1 C) (Oral)  Ht 5\' 3"  (1.6 m)  Wt 127 lb 12.8 oz (57.97 kg)  BMI 22.64 kg/m2  Breastfeeding? Yes  General:  {gen appearance:16600}   Breasts:  {breast exam:1202::"inspection negative, no nipple discharge or bleeding, no masses or nodularity palpable"}  Lungs: {lung exam:16931}  Heart:  {heart exam:5510}  Abdomen: {abdomen exam:16834}   Vulva:  {labia exam:12198}  Vagina: {vagina exam:12200}  Cervix:  {cervix exam:14595}  Corpus: {uterus exam:12215}  Adnexa:  {adnexa exam:12223}  Rectal Exam: {rectal/vaginal exam:12274}        Assessment:    *** postpartum exam. Pap smear {done:10129} at today's visit.   Plan:    1. Contraception: {method:5051} 2. *** 3. Follow up in: {1-10:13787} {time; units:19136} or as needed.

## 2012-02-04 ENCOUNTER — Encounter (HOSPITAL_COMMUNITY): Payer: Self-pay | Admitting: Emergency Medicine

## 2012-02-04 ENCOUNTER — Emergency Department (HOSPITAL_COMMUNITY)
Admission: EM | Admit: 2012-02-04 | Discharge: 2012-02-04 | Disposition: A | Discharge status: Home / Self Care | Payer: Medicaid Other | Attending: Emergency Medicine | Admitting: Emergency Medicine

## 2012-02-04 DIAGNOSIS — X58XXXA Exposure to other specified factors, initial encounter: Secondary | ICD-10-CM | POA: Insufficient documentation

## 2012-02-04 DIAGNOSIS — S0500XA Injury of conjunctiva and corneal abrasion without foreign body, unspecified eye, initial encounter: Secondary | ICD-10-CM

## 2012-02-04 DIAGNOSIS — H109 Unspecified conjunctivitis: Secondary | ICD-10-CM

## 2012-02-04 DIAGNOSIS — S058X9A Other injuries of unspecified eye and orbit, initial encounter: Secondary | ICD-10-CM | POA: Insufficient documentation

## 2012-02-04 MED ORDER — FLUORESCEIN SODIUM 1 MG OP STRP
1.0000 | ORAL_STRIP | Freq: Once | OPHTHALMIC | Status: AC
  Administered 2012-02-04: 1 via OPHTHALMIC
  Filled 2012-02-04: qty 1

## 2012-02-04 MED ORDER — ERYTHROMYCIN 5 MG/GM OP OINT: TOPICAL_OINTMENT | OPHTHALMIC | Status: AC

## 2012-02-04 MED ORDER — TETRACAINE HCL 0.5 % OP SOLN
1.0000 [drp] | Freq: Once | OPHTHALMIC | Status: AC
  Administered 2012-02-04: 1 [drp] via OPHTHALMIC
  Filled 2012-02-04: qty 2

## 2012-02-04 NOTE — ED Notes (Signed)
Visual acuity 20/20 with both eyes. 20/25 with Left eye.

## 2012-02-04 NOTE — ED Provider Notes (Signed)
History     CSN: [id number]  Arrival date & time 02/04/12  7829   First MD Initiated Contact with Patient 02/04/12 2134      Chief Complaint  Patient presents with  . Eye Pain    (Consider location/radiation/quality/duration/timing/severity/associated sxs/prior treatment) HPI Comments: Patient reports sore, itchy eye that began yesterday.  Reports it feels like sand or grit is in her eye.  Denies discharge, visual changes, sensitivity to light.  Denies any trauma to the eye but has been rubbing eyes for relief of itching.  Denies any sick contacts.  Denies fevers, chills, nasal congestion, rhinorrhea, ear pain, sore throat.  Does not wear contact lenses.  Right eye is unaffected.   Patient is a 30 y.o. female presenting with eye pain. The history is provided by the patient.  Eye Pain Pertinent negatives include no chills, congestion, fever or sore throat.    Past Medical History  Diagnosis Date  . No pertinent past medical history     Past Surgical History  Procedure Date  . Induced abortion     Family History  Problem Relation Age of Onset  . Anesthesia problems Neg Hx   . Hypotension Neg Hx   . Malignant hyperthermia Neg Hx   . Pseudochol deficiency Neg Hx     History  Substance Use Topics  . Smoking status: Never Smoker   . Smokeless tobacco: Never Used  . Alcohol Use: No    OB History    Grav Para Term Preterm Abortions TAB SAB Ect Mult Living   3 1 1  2 1    1       Review of Systems  Constitutional: Negative for fever and chills.  HENT: Negative for ear pain, congestion, sore throat and rhinorrhea.   Eyes: Positive for pain and itching. Negative for photophobia, discharge and visual disturbance.    Allergies  Review of patient's allergies indicates no known allergies.  Home Medications   Current Outpatient Rx  Name Route Sig Dispense Refill  . PRENATAL MULTIVITAMIN CH Oral Take 1 tablet by mouth at bedtime.      BP 111/73  Temp 97.9 F (36.6  C) (Oral)  Resp 14  SpO2 98%  Physical Exam  Nursing note and vitals reviewed. Constitutional: She appears well-developed and well-nourished. No distress.  HENT:  Head: Normocephalic and atraumatic.  Eyes: EOM are normal. Pupils are equal, round, and reactive to light. Right eye exhibits no discharge. Left eye exhibits no discharge. Right conjunctiva is not injected. Right conjunctiva has no hemorrhage. Left conjunctiva is injected. Left conjunctiva has no hemorrhage. No scleral icterus.  Slit lamp exam:      The left eye shows fluorescein uptake.    Neck: Neck supple.  Pulmonary/Chest: Effort normal.  Neurological: She is alert.  Skin: She is not diaphoretic.    ED Course  Procedures (including critical care time)  Labs Reviewed - No data to display No results found.   1. Conjunctivitis   2. Corneal abrasion       MDM  Pt with conjunctivitis, tiny corneal abrasion likely from rubbing eye.  Pt is currently breast feeding - I have therefore prescribed erythromycin ointment as it is considered safe in breastfeeding per [place] [employer]oe[employer].  Discussed diagnosis and care plan with patient.  Pt given return precautions.  Pt verbalizes understanding and agrees with plan.           [place], [place] 02/04/12 2225

## 2012-02-04 NOTE — ED Notes (Signed)
PT. REPORTS SORE/ITCHY LEFT EYE ONSET TODAY , NO DRAINAGE , DENIES INJURY. NO BLURRED VISION .

## 2012-02-09 NOTE — ED Provider Notes (Signed)
Medical screening examination/treatment/procedure(s) were performed by non-physician practitioner and as supervising physician I was immediately available for consultation/collaboration.   Jersie Beel E Meggin Ola, MD 02/09/12 0730 

## 2013-09-29 LAB — US OB COMP + 14 WK

## 2013-11-09 LAB — GLUCOSE TOLERANCE, 3 HOURS
GLUCOSE: 95
Glucose: 100

## 2013-11-09 LAB — GLUCOSE TOLERANCE, 2 HOURS
GLUCOSE 2 HOUR GTT: 100 mg/dL (ref ?–140)
Glucose, GTT - 1 Hour: 95 mg/dL (ref ?–200)
Glucose, GTT - Fasting: 73 mg/dL — AB (ref 80–110)

## 2014-02-16 ENCOUNTER — Ambulatory Visit (INDEPENDENT_AMBULATORY_CARE_PROVIDER_SITE_OTHER): Payer: Medicaid Other | Admitting: Family Medicine

## 2014-02-16 ENCOUNTER — Other Ambulatory Visit (HOSPITAL_COMMUNITY)
Admission: RE | Admit: 2014-02-16 | Discharge: 2014-02-16 | Disposition: A | Discharge status: Home / Self Care | Payer: Medicaid Other | Source: Ambulatory Visit | Attending: Family Medicine | Admitting: Family Medicine

## 2014-02-16 ENCOUNTER — Encounter: Payer: Self-pay | Admitting: Family Medicine

## 2014-02-16 VITALS — BP 116/62 | HR 87 | Temp 98.1°F | Wt 143.2 lb

## 2014-02-16 DIAGNOSIS — Z3480 Encounter for supervision of other normal pregnancy, unspecified trimester: Secondary | ICD-10-CM | POA: Insufficient documentation

## 2014-02-16 DIAGNOSIS — Z01419 Encounter for gynecological examination (general) (routine) without abnormal findings: Secondary | ICD-10-CM | POA: Insufficient documentation

## 2014-02-16 DIAGNOSIS — Z3483 Encounter for supervision of other normal pregnancy, third trimester: Secondary | ICD-10-CM

## 2014-02-16 DIAGNOSIS — O0930 Supervision of pregnancy with insufficient antenatal care, unspecified trimester: Secondary | ICD-10-CM

## 2014-02-16 DIAGNOSIS — Z23 Encounter for immunization: Secondary | ICD-10-CM

## 2014-02-16 DIAGNOSIS — Z113 Encounter for screening for infections with a predominantly sexual mode of transmission: Secondary | ICD-10-CM | POA: Insufficient documentation

## 2014-02-16 DIAGNOSIS — Z1151 Encounter for screening for human papillomavirus (HPV): Secondary | ICD-10-CM | POA: Insufficient documentation

## 2014-02-16 LAB — POCT URINALYSIS DIP (DEVICE)
Bilirubin Urine: NEGATIVE
GLUCOSE, UA: NEGATIVE mg/dL
Hgb urine dipstick: NEGATIVE
KETONES UR: NEGATIVE mg/dL
Nitrite: NEGATIVE
Protein, ur: NEGATIVE mg/dL
SPECIFIC GRAVITY, URINE: 1.015 (ref 1.005–1.030)
UROBILINOGEN UA: 0.2 mg/dL (ref 0.0–1.0)
pH: 7 (ref 5.0–8.0)

## 2014-02-16 LAB — OB RESULTS CONSOLE GC/CHLAMYDIA
CHLAMYDIA, DNA PROBE: NEGATIVE
Gonorrhea: NEGATIVE

## 2014-02-16 LAB — OB RESULTS CONSOLE GBS: GBS: NEGATIVE

## 2014-02-16 MED ORDER — TETANUS-DIPHTH-ACELL PERTUSSIS 5-2.5-18.5 LF-MCG/0.5 IM SUSP
0.5000 mL | Freq: Once | INTRAMUSCULAR | Status: AC
  Administered 2014-02-16: 0.5 mL via INTRAMUSCULAR

## 2014-02-16 NOTE — Progress Notes (Signed)
Tdap given.  

## 2014-02-16 NOTE — Progress Notes (Signed)
   Subjective:    [name] is a F7P1025 [redacted]w[redacted]d being seen today for her first obstetrical visit.  Her obstetrical history is significant for late transfer to care. Patient does intend to breast feed. Pregnancy history fully reviewed.  Patient reports intermittent contractions.  Denies leaking fluid, bleeding, decreased fetal activity, headache.Danley Danker Vitals:   02/16/14 0814  BP: 116/62  Pulse: 87  Temp: 98.1 F (36.7 C)  Weight: 64.955 kg (143 lb 3.2 oz)    HISTORY: OB History  Gravida Para Term Preterm AB SAB TAB Ectopic Multiple Living  4 1 1  0 2 0 2 0 0 1    # Outcome Date GA Lbr Len/2nd Weight Sex Delivery Anes PTL Lv  4 CUR           3 TRM 12/14/11 [redacted]w[redacted]d 03:03 / 08:32 3.445 kg (7 lb 9.5 oz) M VAC EPI  Y  2 TAB           1 TAB              Past Medical History  Diagnosis Date  . No pertinent past medical history    Past Surgical History  Procedure Laterality Date  . Induced abortion     Family History  Problem Relation Age of Onset  . Anesthesia problems Neg Hx   . Hypotension Neg Hx   . Malignant hyperthermia Neg Hx   . Pseudochol deficiency Neg Hx      Exam    Uterus:     Pelvic Exam:    Perineum: No Hemorrhoids, Normal Perineum   Vulva: normal, perineum well healed   Vagina:  normal mucosa   Cervix: no bleeding following Pap, no cervical motion tenderness and no lesions   Adnexa: not evaluated  System: Breast:  Deferred   Skin: normal coloration and turgor, no rashes    Neurologic: oriented, normal   Extremities: normal strength, tone, and muscle mass   HEENT PERRLA   Mouth/Teeth mucous membranes moist, pharynx normal without lesions   Neck supple   Cardiovascular: regular rate and rhythm, no murmurs or gallops   Respiratory:  appears well, vitals normal, no respiratory distress, acyanotic, normal RR, ear and throat exam is normal, neck free of mass or lymphadenopathy, chest clear, no wheezing, crepitations, rhonchi, normal symmetric  air entry   Abdomen: soft, non-tender; bowel sounds normal; no masses,  no organomegaly Fundal height 36.   Urinary: urethral meatus normal      Assessment:    Pregnancy: [id number] Patient Active Problem List   Diagnosis Date Noted  . Supervision of other normal pregnancy 02/16/2014  . Nausea & vomiting 12/23/2011  . Fibroid uterus 11/12/2011        Plan:     Initial labs drawn. Prenatal vitamins. Problem list reviewed and updated.  Follow up in 1 weeks. 50% of 50 min visit spent on counseling and coordination of care.  US done due to unconfirmed lie by leapold's maneuver - Baby vertex OP   STINSON, JACOB JEHIEL 02/16/2014

## 2014-02-16 NOTE — Patient Instructions (Addendum)
Third Trimester of Pregnancy The third trimester is from week 29 through week 42, months 7 through 9. The third trimester is a time when the fetus is growing rapidly. At the end of the ninth month, the fetus is about 20 inches in length and weighs 6-10 pounds.  BODY CHANGES Your body goes through many changes during pregnancy. The changes vary from woman to woman.   Your weight will continue to increase. You can expect to gain 25-35 pounds (11-16 kg) by the end of the pregnancy.  You may begin to get stretch marks on your hips, abdomen, and breasts.  You may urinate more often because the fetus is moving lower into your pelvis and pressing on your bladder.  You may develop or continue to have heartburn as a result of your pregnancy.  You may develop constipation because certain hormones are causing the muscles that push waste through your intestines to slow down.  You may develop hemorrhoids or swollen, bulging veins (varicose veins).  You may have pelvic pain because of the weight gain and pregnancy hormones relaxing your joints between the bones in your pelvis. Backaches may result from overexertion of the muscles supporting your posture.  You may have changes in your hair. These can include thickening of your hair, rapid growth, and changes in texture. Some women also have hair loss during or after pregnancy, or hair that feels dry or thin. Your hair will most likely return to normal after your baby is born.  Your breasts will continue to grow and be tender. A yellow discharge may leak from your breasts called colostrum.  Your belly button may stick out.  You may feel short of breath because of your expanding uterus.  You may notice the fetus "dropping," or moving lower in your abdomen.  You may have a bloody mucus discharge. This usually occurs a few days to a week before labor begins.  Your cervix becomes thin and soft (effaced) near your due date. WHAT TO EXPECT AT YOUR PRENATAL  EXAMS  You will have prenatal exams every 2 weeks until week 36. Then, you will have weekly prenatal exams. During a routine prenatal visit:  You will be weighed to make sure you and the fetus are growing normally.  Your blood pressure is taken.  Your abdomen will be measured to track your baby's growth.  The fetal heartbeat will be listened to.  Any test results from the previous visit will be discussed.  You may have a cervical check near your due date to see if you have effaced. At around 36 weeks, your caregiver will check your cervix. At the same time, your caregiver will also perform a test on the secretions of the vaginal tissue. This test is to determine if a type of bacteria, Group B streptococcus, is present. Your caregiver will explain this further. Your caregiver may ask you:  What your birth plan is.  How you are feeling.  If you are feeling the baby move.  If you have had any abnormal symptoms, such as leaking fluid, bleeding, severe headaches, or abdominal cramping.  If you have any questions. Other tests or screenings that may be performed during your third trimester include:  Blood tests that check for low iron levels (anemia).  Fetal testing to check the health, activity level, and growth of the fetus. Testing is done if you have certain medical conditions or if there are problems during the pregnancy. FALSE LABOR You may feel small, irregular contractions that   eventually go away. These are called Braxton Hicks contractions, or false labor. Contractions may last for hours, days, or even weeks before true labor sets in. If contractions come at regular intervals, intensify, or become painful, it is best to be seen by your caregiver.  SIGNS OF LABOR   Menstrual-like cramps.  Contractions that are 5 minutes apart or less.  Contractions that start on the top of the uterus and spread down to the lower abdomen and back.  A sense of increased pelvic pressure or back  pain.  A watery or bloody mucus discharge that comes from the vagina. If you have any of these signs before the 37th week of pregnancy, call your caregiver right away. You need to go to the hospital to get checked immediately. HOME CARE INSTRUCTIONS   Avoid all smoking, herbs, alcohol, and unprescribed drugs. These chemicals affect the formation and growth of the baby.  Follow your caregiver's instructions regarding medicine use. There are medicines that are either safe or unsafe to take during pregnancy.  Exercise only as directed by your caregiver. Experiencing uterine cramps is a good sign to stop exercising.  Continue to eat regular, healthy meals.  Wear a good support bra for breast tenderness.  Do not use hot tubs, steam rooms, or saunas.  Wear your seat belt at all times when driving.  Avoid raw meat, uncooked cheese, cat litter boxes, and soil used by cats. These carry germs that can cause birth defects in the baby.  Take your prenatal vitamins.  Try taking a stool softener (if your caregiver approves) if you develop constipation. Eat more high-fiber foods, such as fresh vegetables or fruit and whole grains. Drink plenty of fluids to keep your urine clear or pale yellow.  Take warm sitz baths to soothe any pain or discomfort caused by hemorrhoids. Use hemorrhoid cream if your caregiver approves.  If you develop varicose veins, wear support hose. Elevate your feet for 15 minutes, 3-4 times a day. Limit salt in your diet.  Avoid heavy lifting, wear low heal shoes, and practice good posture.  Rest a lot with your legs elevated if you have leg cramps or low back pain.  Visit your dentist if you have not gone during your pregnancy. Use a soft toothbrush to brush your teeth and be gentle when you floss.  A sexual relationship may be continued unless your caregiver directs you otherwise.  Do not travel far distances unless it is absolutely necessary and only with the approval  of your caregiver.  Take prenatal classes to understand, practice, and ask questions about the labor and delivery.  Make a trial run to the hospital.  Pack your hospital bag.  Prepare the baby's nursery.  Continue to go to all your prenatal visits as directed by your caregiver. SEEK MEDICAL CARE IF:  You are unsure if you are in labor or if your water has broken.  You have dizziness.  You have mild pelvic cramps, pelvic pressure, or nagging pain in your abdominal area.  You have persistent nausea, vomiting, or diarrhea.  You have a bad smelling vaginal discharge.  You have pain with urination. SEEK IMMEDIATE MEDICAL CARE IF:   You have a fever.  You are leaking fluid from your vagina.  You have spotting or bleeding from your vagina.  You have severe abdominal cramping or pain.  You have rapid weight loss or gain.  You have shortness of breath with chest pain.  You notice sudden or extreme swelling   of your face, hands, ankles, feet, or legs.  You have not felt your baby move in over an hour.  You have severe headaches that do not go away with medicine.  You have vision changes. Document Released: 05/27/2001 Document Revised: 06/07/2013 Document Reviewed: 08/03/2012 Exit[employer] Patient Information 2015 [employer]. This information is not intended to replace advice given to you by your health care provider. Make sure you discuss any questions you have with your health care provider.  

## 2014-02-17 LAB — HEMOGLOBINOPATHY EVALUATION
Hemoglobin Other: 0 %
Hgb A2 Quant: 2.5 % (ref 2.2–3.2)
Hgb A: 97.5 % (ref 96.8–97.8)
Hgb F Quant: 0 % (ref 0.0–2.0)
Hgb S Quant: 0 %

## 2014-02-17 LAB — OBSTETRIC PANEL
Antibody Screen: NEGATIVE
Basophils Absolute: 0 10*3/uL (ref 0.0–0.1)
Basophils Relative: 0 % (ref 0–1)
Eosinophils Absolute: 0.3 10*3/uL (ref 0.0–0.7)
Eosinophils Relative: 4 % (ref 0–5)
HCT: 34.7 % — ABNORMAL LOW (ref 36.0–46.0)
Hemoglobin: 11.1 g/dL — ABNORMAL LOW (ref 12.0–15.0)
Hepatitis B Surface Ag: NEGATIVE
Lymphocytes Relative: 22 % (ref 12–46)
Lymphs Abs: 1.8 10*3/uL (ref 0.7–4.0)
MCH: 29.1 pg (ref 26.0–34.0)
MCHC: 32 g/dL (ref 30.0–36.0)
MCV: 91.1 fL (ref 78.0–100.0)
Monocytes Absolute: 0.7 10*3/uL (ref 0.1–1.0)
Monocytes Relative: 8 % (ref 3–12)
Neutro Abs: 5.5 10*3/uL (ref 1.7–7.7)
Neutrophils Relative %: 66 % (ref 43–77)
Platelets: 279 10*3/uL (ref 150–400)
RBC: 3.81 MIL/uL — ABNORMAL LOW (ref 3.87–5.11)
RDW: 14.5 % (ref 11.5–15.5)
Rh Type: POSITIVE
Rubella: 29.6 {index} — ABNORMAL HIGH
WBC: 8.4 10*3/uL (ref 4.0–10.5)

## 2014-02-17 LAB — HIV ANTIBODY (ROUTINE TESTING W REFLEX): HIV: NONREACTIVE

## 2014-02-18 LAB — PRESCRIPTION MONITORING PROFILE (19 PANEL)
AMPHETAMINE/METH: NEGATIVE ng/mL
BARBITURATE SCREEN, URINE: NEGATIVE ng/mL
BENZODIAZEPINE SCREEN, URINE: NEGATIVE ng/mL
BUPRENORPHINE, URINE: NEGATIVE ng/mL
Cannabinoid Scrn, Ur: NEGATIVE ng/mL
Carisoprodol, Urine: NEGATIVE ng/mL
Cocaine Metabolites: NEGATIVE ng/mL
Creatinine, Urine: 55.52 mg/dL (ref >20.0)
Fentanyl, Ur: NEGATIVE ng/mL
MDMA URINE: NEGATIVE ng/mL
METHAQUALONE SCREEN (URINE): NEGATIVE ng/mL
Meperidine, Ur: NEGATIVE ng/mL
Methadone Screen, Urine: NEGATIVE ng/mL
NITRITES URINE, INITIAL: NEGATIVE ug/mL
Opiate Screen, Urine: NEGATIVE ng/mL
Oxycodone Screen, Ur: NEGATIVE ng/mL
PROPOXYPHENE: NEGATIVE ng/mL
Phencyclidine, Ur: NEGATIVE ng/mL
TAPENTADOLUR: NEGATIVE ng/mL
TRAMADOL UR: NEGATIVE ng/mL
ZOLPIDEM, URINE: NEGATIVE ng/mL
pH, Initial: 7.8 pH (ref 4.5–8.9)

## 2014-02-18 LAB — CULTURE, OB URINE
Colony Count: NO GROWTH
Organism ID, Bacteria: NO GROWTH

## 2014-02-19 LAB — CULTURE, BETA STREP (GROUP B ONLY)

## 2014-02-21 LAB — CYTOLOGY - PAP

## 2014-02-23 ENCOUNTER — Ambulatory Visit (INDEPENDENT_AMBULATORY_CARE_PROVIDER_SITE_OTHER): Payer: Medicaid Other | Admitting: Family

## 2014-02-23 VITALS — BP 106/61 | HR 84 | Temp 97.4°F | Wt 145.6 lb

## 2014-02-23 DIAGNOSIS — Z23 Encounter for immunization: Secondary | ICD-10-CM | POA: Diagnosis not present

## 2014-02-23 DIAGNOSIS — Z3480 Encounter for supervision of other normal pregnancy, unspecified trimester: Secondary | ICD-10-CM

## 2014-02-23 DIAGNOSIS — Z3483 Encounter for supervision of other normal pregnancy, third trimester: Secondary | ICD-10-CM

## 2014-02-23 LAB — POCT URINALYSIS DIP (DEVICE)
BILIRUBIN URINE: NEGATIVE
Glucose, UA: NEGATIVE mg/dL
HGB URINE DIPSTICK: NEGATIVE
Ketones, ur: NEGATIVE mg/dL
NITRITE: NEGATIVE
PH: 7.5 (ref 5.0–8.0)
Protein, ur: NEGATIVE mg/dL
Specific Gravity, Urine: 1.015 (ref 1.005–1.030)
UROBILINOGEN UA: 0.2 mg/dL (ref 0.0–1.0)

## 2014-02-23 NOTE — Progress Notes (Signed)
Reports intermittent bilateral hip pain and occasional contractions.

## 2014-02-23 NOTE — Progress Notes (Signed)
Reviewed GBS results (neg).  No questions or concerns.  Reviewed signs of labor.  Flu vaccine today.

## 2014-02-24 ENCOUNTER — Encounter: Payer: Medicaid Other | Admitting: Obstetrics and Gynecology

## 2014-03-02 ENCOUNTER — Ambulatory Visit (INDEPENDENT_AMBULATORY_CARE_PROVIDER_SITE_OTHER): Payer: Medicaid Other | Admitting: Obstetrics and Gynecology

## 2014-03-02 ENCOUNTER — Encounter: Payer: Self-pay | Admitting: Obstetrics and Gynecology

## 2014-03-02 VITALS — BP 104/68 | HR 78 | Temp 97.7°F | Wt 145.5 lb

## 2014-03-02 DIAGNOSIS — Z3480 Encounter for supervision of other normal pregnancy, unspecified trimester: Secondary | ICD-10-CM

## 2014-03-02 DIAGNOSIS — Z3483 Encounter for supervision of other normal pregnancy, third trimester: Secondary | ICD-10-CM

## 2014-03-02 DIAGNOSIS — O0930 Supervision of pregnancy with insufficient antenatal care, unspecified trimester: Secondary | ICD-10-CM

## 2014-03-02 LAB — POCT URINALYSIS DIP (DEVICE)
Bilirubin Urine: NEGATIVE
Glucose, UA: NEGATIVE mg/dL
HGB URINE DIPSTICK: NEGATIVE
KETONES UR: NEGATIVE mg/dL
Nitrite: NEGATIVE
PH: 7 (ref 5.0–8.0)
PROTEIN: NEGATIVE mg/dL
SPECIFIC GRAVITY, URINE: 1.01 (ref 1.005–1.030)
UROBILINOGEN UA: 0.2 mg/dL (ref 0.0–1.0)

## 2014-03-02 NOTE — Progress Notes (Signed)
Patient is doing well with occasional pelvic pressure. FM/labor precautions reviewed

## 2014-03-02 NOTE — Progress Notes (Signed)
Pt reports pain in right lower abd/groin

## 2014-03-09 ENCOUNTER — Ambulatory Visit (INDEPENDENT_AMBULATORY_CARE_PROVIDER_SITE_OTHER): Payer: Medicaid Other | Admitting: Family

## 2014-03-09 VITALS — BP 121/73 | HR 85 | Temp 97.5°F | Wt 149.5 lb

## 2014-03-09 DIAGNOSIS — Z3483 Encounter for supervision of other normal pregnancy, third trimester: Secondary | ICD-10-CM

## 2014-03-09 DIAGNOSIS — Z3480 Encounter for supervision of other normal pregnancy, unspecified trimester: Secondary | ICD-10-CM

## 2014-03-09 LAB — POCT URINALYSIS DIP (DEVICE)
Bilirubin Urine: NEGATIVE
GLUCOSE, UA: NEGATIVE mg/dL
HGB URINE DIPSTICK: NEGATIVE
Ketones, ur: NEGATIVE mg/dL
NITRITE: NEGATIVE
PH: 7 (ref 5.0–8.0)
PROTEIN: NEGATIVE mg/dL
SPECIFIC GRAVITY, URINE: 1.01 (ref 1.005–1.030)
UROBILINOGEN UA: 0.2 mg/dL (ref 0.0–1.0)

## 2014-03-09 NOTE — Progress Notes (Signed)
Anatomy U/S with Radiology 03/15/14 (first available) @ 2p.

## 2014-03-09 NOTE — Progress Notes (Signed)
No questions or concerns.  Scheduled anatomy ultrasound.  Appt next week with NST.

## 2014-03-12 ENCOUNTER — Inpatient Hospital Stay (HOSPITAL_COMMUNITY)
Admission: AD | Admit: 2014-03-12 | Discharge: 2014-03-12 | Disposition: A | Discharge status: Home / Self Care | Payer: Medicaid Other | Source: Ambulatory Visit | Attending: Obstetrics and Gynecology | Admitting: Obstetrics and Gynecology

## 2014-03-12 ENCOUNTER — Encounter (HOSPITAL_COMMUNITY): Payer: Self-pay | Admitting: *Deleted

## 2014-03-12 DIAGNOSIS — O479 False labor, unspecified: Secondary | ICD-10-CM

## 2014-03-12 NOTE — MAU Note (Signed)
Pt presents to MAU with complaints of contractions that started around 6 this morning. Denies any vaginal bleeding or LOF

## 2014-03-12 NOTE — Discharge Instructions (Signed)

## 2014-03-15 ENCOUNTER — Ambulatory Visit (HOSPITAL_COMMUNITY)
Admission: RE | Admit: 2014-03-15 | Discharge: 2014-03-15 | Disposition: A | Discharge status: Home / Self Care | Payer: Medicaid Other | Source: Ambulatory Visit | Attending: Family | Admitting: Family

## 2014-03-15 DIAGNOSIS — O480 Post-term pregnancy: Secondary | ICD-10-CM | POA: Insufficient documentation

## 2014-03-15 DIAGNOSIS — Z1389 Encounter for screening for other disorder: Secondary | ICD-10-CM | POA: Insufficient documentation

## 2014-03-15 DIAGNOSIS — O0930 Supervision of pregnancy with insufficient antenatal care, unspecified trimester: Secondary | ICD-10-CM | POA: Diagnosis not present

## 2014-03-15 DIAGNOSIS — O0933 Supervision of pregnancy with insufficient antenatal care, third trimester: Secondary | ICD-10-CM

## 2014-03-15 DIAGNOSIS — Z363 Encounter for antenatal screening for malformations: Secondary | ICD-10-CM | POA: Insufficient documentation

## 2014-03-15 DIAGNOSIS — Z3483 Encounter for supervision of other normal pregnancy, third trimester: Secondary | ICD-10-CM

## 2014-03-15 IMAGING — US US OB COMP +14 WK
1 series · 12 of 28 positions shown · non-contrast
Comparison: none

[Series 1: us ob comp +14 wk mfm · 85 acquisitions, 12 frames shown]
[im 4/85]
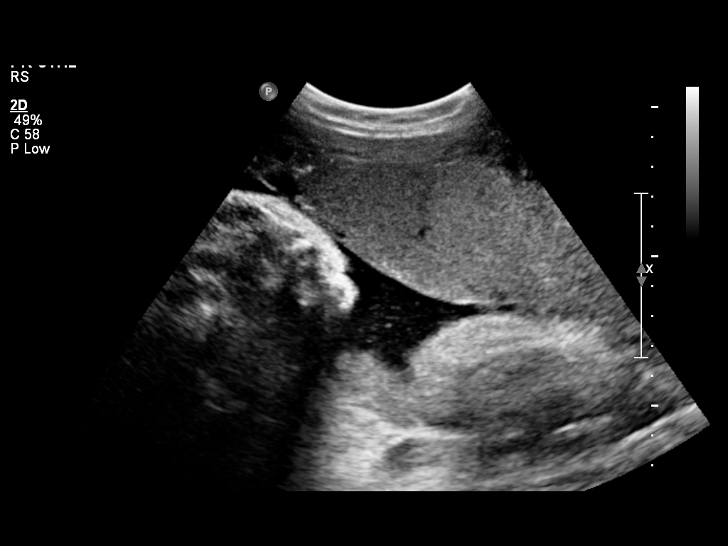
[im 10/85]
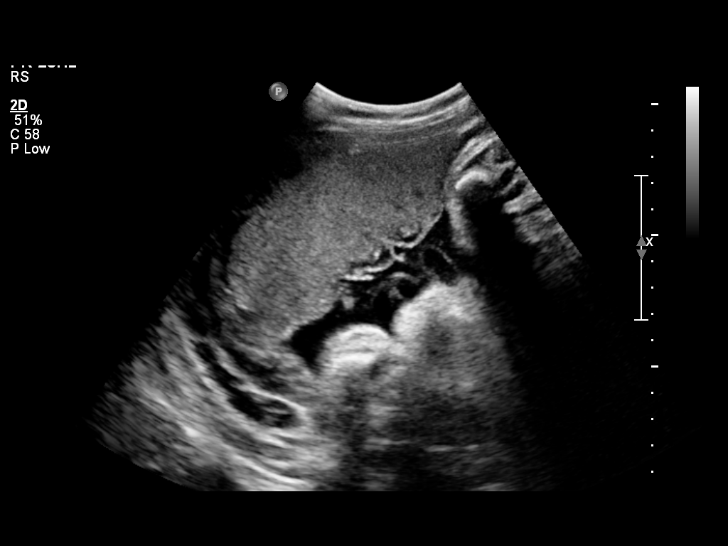
[im 16/85]
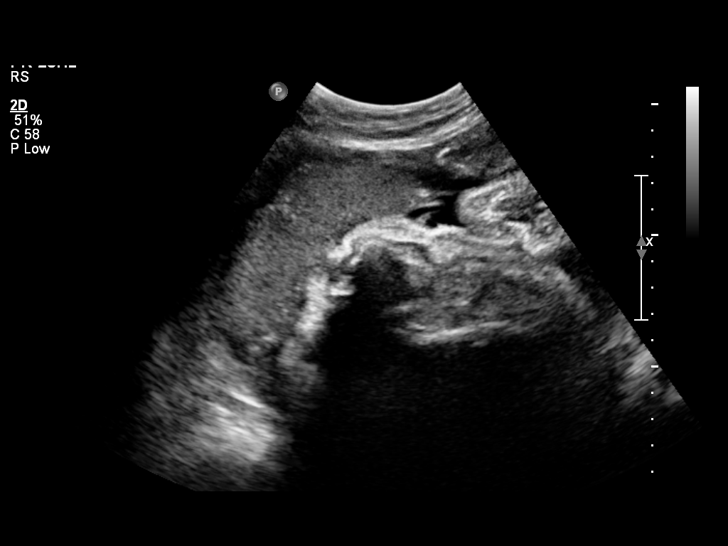
[im 25/85]
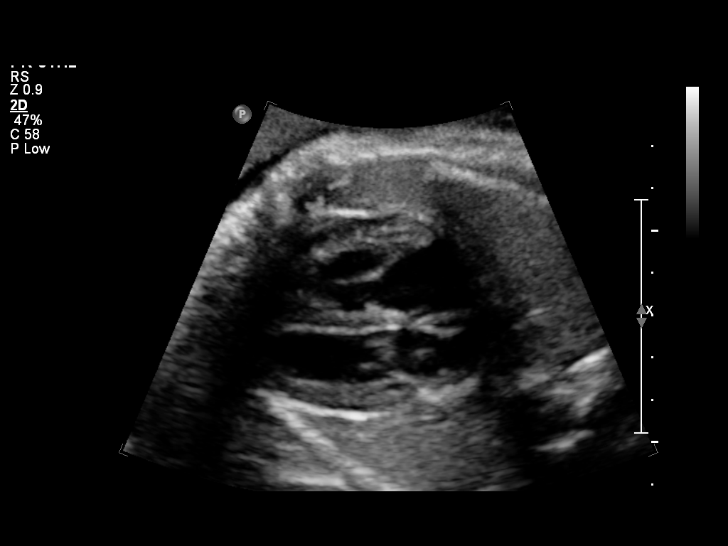
[im 32/85]
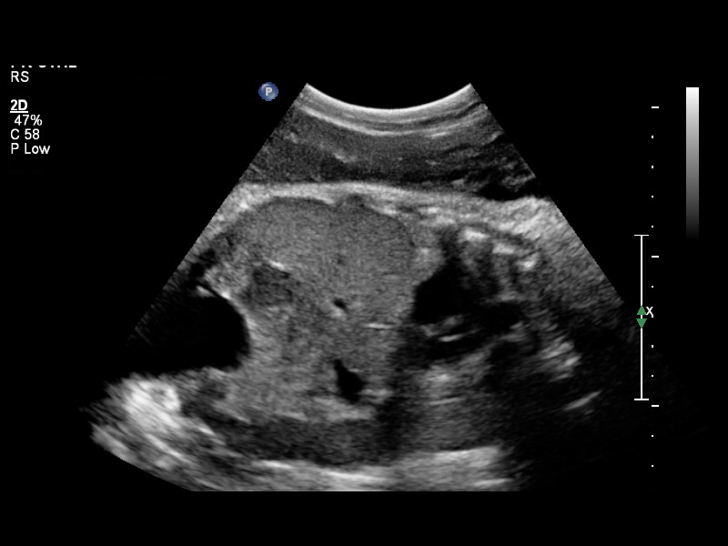
[im 38/85]
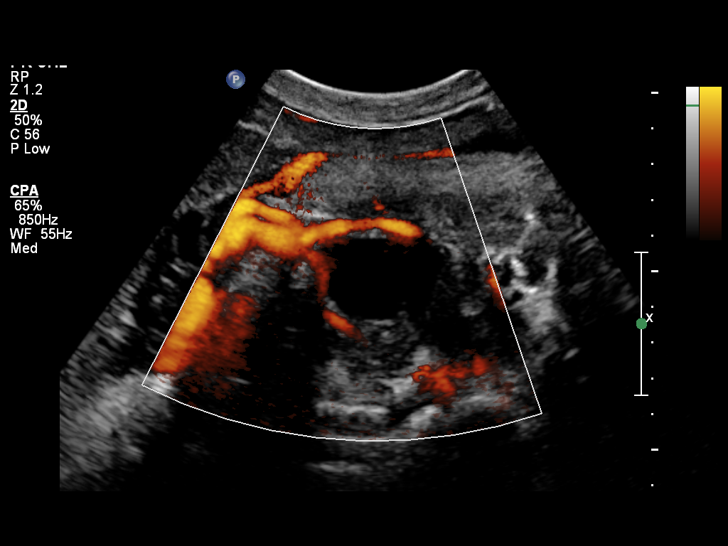
[im 47/85]
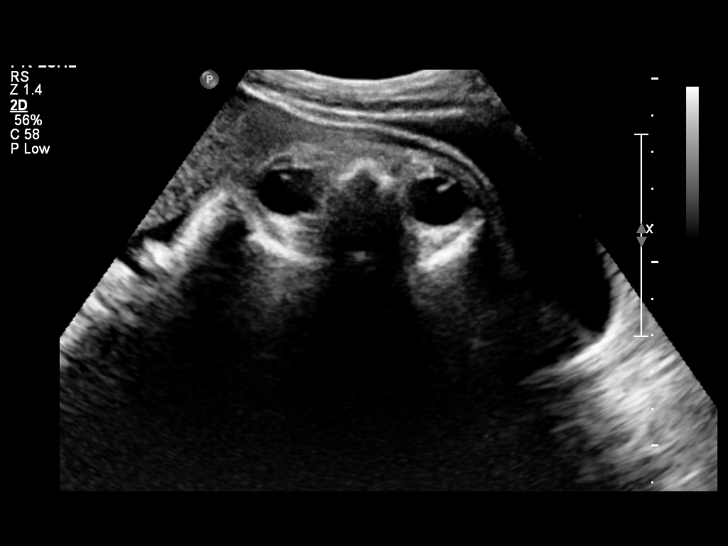
[im 53/85]
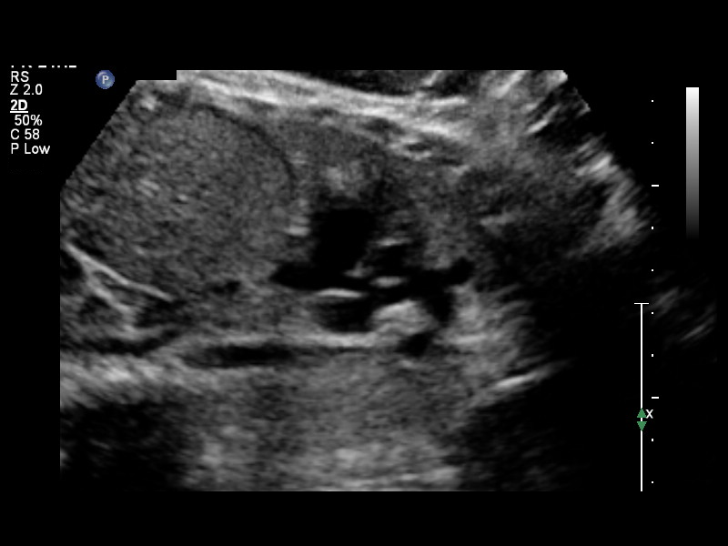
[im 60/85]
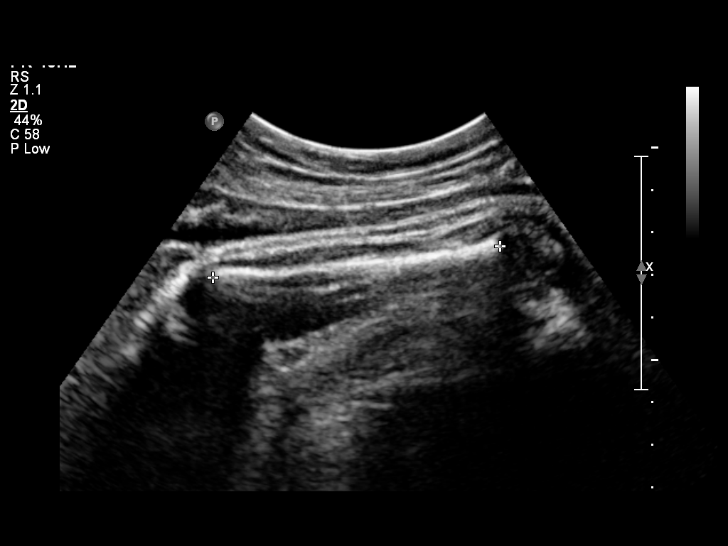
[im 69/85]
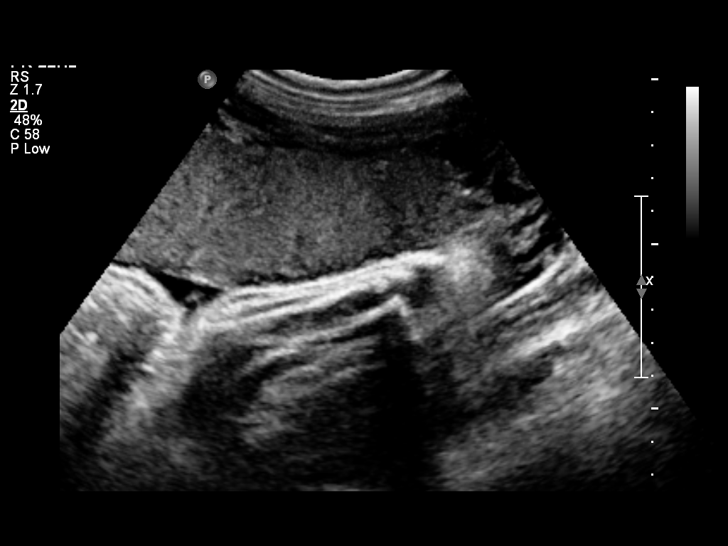
[im 75/85]
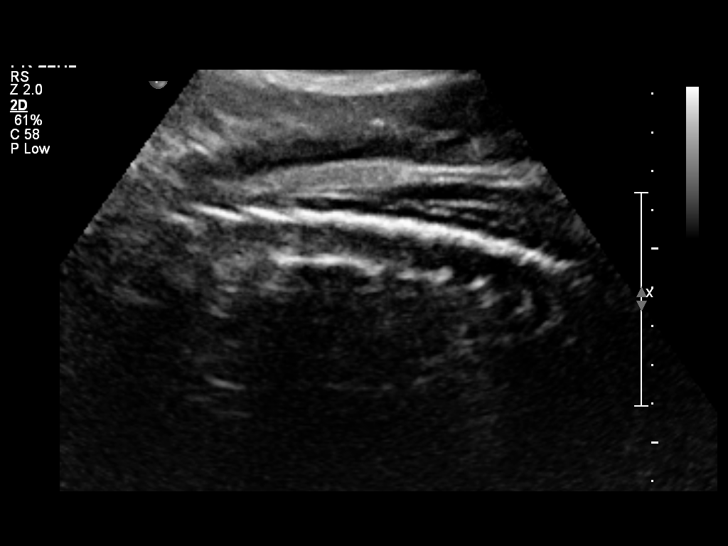
[im 81/85]
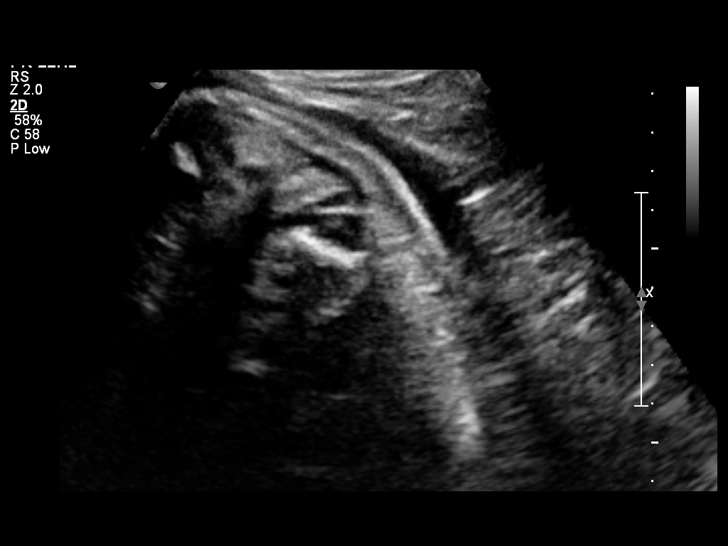

[12 of 28 positions shown; findings below may reference images not displayed]

OBSTETRICS REPORT
                      (Signed Final [DATE] [DATE])

Service(s) Provided

 US OB COMP + 14 WK                                    76805.1
Indications

 No or Little Prenatal Care                            [BA]
 Basic anatomic survey                                 [BA]
 Postdate pregnancy (40-42 weeks)
Fetal Evaluation

 Num Of Fetuses:    1
 Fetal Heart Rate:  142                          bpm
 Cardiac Activity:  Observed
 Presentation:      Cephalic
 Placenta:          Anterior, above cervical os
 P. Cord            Visualized, central
 Insertion:

 Amniotic Fluid
 AFI FV:      Subjectively within normal limits
 AFI Sum:     9.79    cm       30  %Tile     Larg Pckt:    3.93  cm
 RUQ:   3.12    cm   RLQ:    2.74   cm    LUQ:   0       cm   LLQ:    3.93   cm
Biometry

 BPD:     95.2  mm     G. Age:  38w 6d                CI:        76.09   70 - 86
                                                      FL/HC:      22.0   20.7 -

 HC:     345.9  mm     G. Age:  40w 1d                HC/AC:      0.99   0.87 -

 AC:     350.3  mm     G. Age:  39w 0d                FL/BPD:     79.8   71 - 87
 FL:        76  mm     G. Age:  38w 6d                FL/AC:      21.7   20 - 24
 HUM:     68.1  mm     G. Age:  39w 5d

 Est. FW:    [BA]  gm      8 lb 1 oz     77  %
Gestational Age

 U/S Today:     39w 2d                                        EDD:   [DATE]
 Best:          40w 3d     Det. By:  Previous Ultrasound      EDD:   [DATE]
                                     ([DATE])
Anatomy
 Cranium:          Not well visualized    Aortic Arch:      Appears normal
 Fetal Cavum:      Not well visualized    Ductal Arch:      Not well visualized
 Ventricles:       Not well visualized    Diaphragm:        Appears normal
 Choroid Plexus:   Not well visualized    Stomach:          Appears normal, left
                                                            sided
 Cerebellum:       Not well visualized    Abdomen:          Appears normal
 Posterior Fossa:  Not well visualized    Abdominal Wall:   Not well visualized
 Nuchal Fold:      Not applicable (>20    Cord Vessels:     Appears normal (3
                   wks GA)                                  vessel cord)
 Face:             Orbits appear          Kidneys:          Appear normal
                   normal
 Lips:             Not well visualized    Bladder:          Appears normal
 Heart:            Appears normal         Spine:            Ltd views no
                   (4CH, axis, and                          intracranial signs of
                   situs)                                   NTD
 RVOT:             Appears normal         Lower             Visualized
                                          Extremities:
 LVOT:             Appears normal         Upper             Visualized
                                          Extremities:

 Other:  Fetus appears to be a male. Nasal bone visualized. Technically
         difficult due to advanced GA and fetal position.
Cervix Uterus Adnexa

 Cervix:       Not visualized (advanced GA >[BA])
 Uterus:       No abnormality visualized.

 Left Ovary:    Not visualized.
 Right Ovary:   Within normal limits.
 Adnexa:     No adnexal mass visualized.
Impression

 Single IUP at 40w 3d
 Limited views of the fetal anatomy were obtained due to late
 gestational age
 No gross anomalies were noted
 The estimated fetal weight today is at the 77th %tile ([BA] g)
 Anterior placenta without previa
 Normal amniotic fluid volume
Recommendations

 Recommend antenatal testing due to post term pregnancy.
 Follow-up ultrasounds as clinically indicated.

## 2014-03-16 ENCOUNTER — Encounter: Payer: Self-pay | Admitting: Family

## 2014-03-16 ENCOUNTER — Ambulatory Visit (INDEPENDENT_AMBULATORY_CARE_PROVIDER_SITE_OTHER): Payer: Medicaid Other | Admitting: Obstetrics and Gynecology

## 2014-03-16 VITALS — BP 113/66 | HR 113 | Wt 149.1 lb

## 2014-03-16 DIAGNOSIS — O480 Post-term pregnancy: Secondary | ICD-10-CM

## 2014-03-16 LAB — POCT URINALYSIS DIP (DEVICE)
BILIRUBIN URINE: NEGATIVE
Glucose, UA: NEGATIVE mg/dL
HGB URINE DIPSTICK: NEGATIVE
Ketones, ur: NEGATIVE mg/dL
NITRITE: NEGATIVE
PH: 7 (ref 5.0–8.0)
PROTEIN: NEGATIVE mg/dL
Specific Gravity, Urine: 1.01 (ref 1.005–1.030)
UROBILINOGEN UA: 0.2 mg/dL (ref 0.0–1.0)

## 2014-03-16 NOTE — Patient Instructions (Signed)

## 2014-03-16 NOTE — Progress Notes (Signed)
US done yesterday.  Pt desires cx exam today

## 2014-03-16 NOTE — Progress Notes (Signed)
[place] yesterday: 77th%ile, sym, AFI 9.79 (30th). Reviewed [place] #1 from [place] 10/28/13 at 20.1. Of note, sucinsuriate lobe on [place]. Pt states no change in EDD from  LMP generated due date (per phone records 2 wks on 06/23/13 but forgets exact LMP) At first reluctant for IOL, then agreeable with cx ripening/IOL in 4-5 days. Kick counts and NST here or MAU  Mon. if no IOL slot until Tues.

## 2014-03-20 ENCOUNTER — Other Ambulatory Visit: Payer: Self-pay | Admitting: Obstetrics & Gynecology

## 2014-03-20 ENCOUNTER — Encounter (HOSPITAL_COMMUNITY)
Admission: AD | Disposition: A | Discharge status: Home / Self Care | Payer: Self-pay | Source: Ambulatory Visit | Attending: Obstetrics & Gynecology

## 2014-03-20 ENCOUNTER — Inpatient Hospital Stay (HOSPITAL_COMMUNITY): Payer: Medicaid Other | Admitting: Anesthesiology

## 2014-03-20 ENCOUNTER — Encounter (HOSPITAL_COMMUNITY): Payer: Self-pay | Admitting: *Deleted

## 2014-03-20 ENCOUNTER — Inpatient Hospital Stay (HOSPITAL_COMMUNITY)
Admission: AD | Admit: 2014-03-20 | Discharge: 2014-03-23 | DRG: 765 | Disposition: A | Discharge status: Home / Self Care | Payer: Medicaid Other | Source: Ambulatory Visit | Attending: Obstetrics & Gynecology | Admitting: Obstetrics & Gynecology

## 2014-03-20 ENCOUNTER — Telehealth (HOSPITAL_COMMUNITY): Payer: Self-pay | Admitting: *Deleted

## 2014-03-20 ENCOUNTER — Encounter (HOSPITAL_COMMUNITY): Payer: Medicaid Other | Admitting: Anesthesiology

## 2014-03-20 ENCOUNTER — Ambulatory Visit (INDEPENDENT_AMBULATORY_CARE_PROVIDER_SITE_OTHER): Payer: Medicaid Other | Admitting: *Deleted

## 2014-03-20 VITALS — BP 117/66 | HR 79

## 2014-03-20 DIAGNOSIS — O480 Post-term pregnancy: Secondary | ICD-10-CM | POA: Insufficient documentation

## 2014-03-20 DIAGNOSIS — O3413 Maternal care for benign tumor of corpus uteri, third trimester: Secondary | ICD-10-CM | POA: Diagnosis present

## 2014-03-20 DIAGNOSIS — O4593 Premature separation of placenta, unspecified, third trimester: Secondary | ICD-10-CM | POA: Diagnosis present

## 2014-03-20 DIAGNOSIS — Z3490 Encounter for supervision of normal pregnancy, unspecified, unspecified trimester: Secondary | ICD-10-CM

## 2014-03-20 DIAGNOSIS — D259 Leiomyoma of uterus, unspecified: Secondary | ICD-10-CM | POA: Diagnosis present

## 2014-03-20 DIAGNOSIS — Z3A41 41 weeks gestation of pregnancy: Secondary | ICD-10-CM | POA: Diagnosis present

## 2014-03-20 HISTORY — DX: Other specified health status: Z78.9

## 2014-03-20 LAB — TYPE AND SCREEN
ABO/RH(D): B POS
Antibody Screen: NEGATIVE

## 2014-03-20 LAB — CBC
HEMATOCRIT: 37.7 % (ref 36.0–46.0)
Hemoglobin: 12.7 g/dL (ref 12.0–15.0)
MCH: 30.9 pg (ref 26.0–34.0)
MCHC: 33.7 g/dL (ref 30.0–36.0)
MCV: 91.7 fL (ref 78.0–100.0)
Platelets: 250 10*3/uL (ref 150–400)
RBC: 4.11 MIL/uL (ref 3.87–5.11)
RDW: 14.5 % (ref 11.5–15.5)
WBC: 10.4 10*3/uL (ref 4.0–10.5)

## 2014-03-20 LAB — RPR

## 2014-03-20 SURGERY — Surgical Case
Anesthesia: Epidural | Site: Abdomen

## 2014-03-20 MED ORDER — DIPHENHYDRAMINE HCL 50 MG/ML IJ SOLN: 12.5000 mg | INTRAMUSCULAR | Status: DC | PRN

## 2014-03-20 MED ORDER — LIDOCAINE HCL (PF) 1 % IJ SOLN: 30.0000 mL | INTRAMUSCULAR | Status: DC | PRN

## 2014-03-20 MED ORDER — PHENYLEPHRINE HCL 10 MG/ML IJ SOLN
INTRAMUSCULAR | Status: DC | PRN
  Administered 2014-03-20 – 2014-03-21 (×5): 40 ug via INTRAVENOUS

## 2014-03-20 MED ORDER — FLEET ENEMA 7-19 GM/118ML RE ENEM: 1.0000 | ENEMA | RECTAL | Status: DC | PRN

## 2014-03-20 MED ORDER — CEFAZOLIN SODIUM-DEXTROSE 2-3 GM-% IV SOLR
INTRAVENOUS | Status: DC | PRN
  Administered 2014-03-20: 2 g via INTRAVENOUS

## 2014-03-20 MED ORDER — FENTANYL 2.5 MCG/ML BUPIVACAINE 1/10 % EPIDURAL INFUSION (WH - ANES)
INTRAMUSCULAR | Status: DC | PRN
  Administered 2014-03-20: 14 mL/h via EPIDURAL

## 2014-03-20 MED ORDER — ONDANSETRON HCL 4 MG/2ML IJ SOLN
INTRAMUSCULAR | Status: DC | PRN
  Administered 2014-03-20: 4 mg via INTRAVENOUS

## 2014-03-20 MED ORDER — OXYTOCIN 40 UNITS IN LACTATED RINGERS INFUSION - SIMPLE MED
1.0000 m[IU]/min | INTRAVENOUS | Status: DC
  Administered 2014-03-20: 2 m[IU]/min via INTRAVENOUS

## 2014-03-20 MED ORDER — FENTANYL CITRATE 0.05 MG/ML IJ SOLN
INTRAMUSCULAR | Status: AC
  Filled 2014-03-20: qty 2

## 2014-03-20 MED ORDER — OXYTOCIN BOLUS FROM INFUSION: 500.0000 mL | INTRAVENOUS | Status: DC

## 2014-03-20 MED ORDER — LACTATED RINGERS IV SOLN: 500.0000 mL | INTRAVENOUS | Status: DC | PRN

## 2014-03-20 MED ORDER — OXYTOCIN 40 UNITS IN LACTATED RINGERS INFUSION - SIMPLE MED
62.5000 mL/h | INTRAVENOUS | Status: DC
  Filled 2014-03-20: qty 1000

## 2014-03-20 MED ORDER — TERBUTALINE SULFATE 1 MG/ML IJ SOLN
0.2500 mg | Freq: Once | INTRAMUSCULAR | Status: AC | PRN
  Administered 2014-03-20: 0.25 mg via SUBCUTANEOUS
  Filled 2014-03-20: qty 1

## 2014-03-20 MED ORDER — ONDANSETRON HCL 4 MG/2ML IJ SOLN: 4.0000 mg | Freq: Four times a day (QID) | INTRAMUSCULAR | Status: DC | PRN

## 2014-03-20 MED ORDER — CITRIC ACID-SODIUM CITRATE 334-500 MG/5ML PO SOLN
30.0000 mL | ORAL | Status: DC | PRN
  Administered 2014-03-20: 30 mL via ORAL
  Filled 2014-03-20: qty 15

## 2014-03-20 MED ORDER — PHENYLEPHRINE 8 MG IN D5W 100 ML (0.08MG/ML) PREMIX OPTIME: INJECTION | INTRAVENOUS | Status: DC | PRN

## 2014-03-20 MED ORDER — LIDOCAINE HCL (PF) 1 % IJ SOLN
INTRAMUSCULAR | Status: DC | PRN
  Administered 2014-03-20 (×2): 4 mL

## 2014-03-20 MED ORDER — LACTATED RINGERS IV SOLN
INTRAVENOUS | Status: DC | PRN
  Administered 2014-03-20: via INTRAVENOUS

## 2014-03-20 MED ORDER — EPHEDRINE 5 MG/ML INJ: 10.0000 mg | INTRAVENOUS | Status: DC | PRN

## 2014-03-20 MED ORDER — LACTATED RINGERS IV SOLN
INTRAVENOUS | Status: DC
  Administered 2014-03-20 – 2014-03-21 (×4): via INTRAVENOUS

## 2014-03-20 MED ORDER — FENTANYL CITRATE 0.05 MG/ML IJ SOLN
INTRAMUSCULAR | Status: DC | PRN
  Administered 2014-03-20 (×2): 100 ug via INTRAVENOUS

## 2014-03-20 MED ORDER — ACETAMINOPHEN 325 MG PO TABS: 650.0000 mg | ORAL_TABLET | ORAL | Status: DC | PRN

## 2014-03-20 MED ORDER — OXYCODONE-ACETAMINOPHEN 5-325 MG PO TABS: 2.0000 | ORAL_TABLET | ORAL | Status: DC | PRN

## 2014-03-20 MED ORDER — PHENYLEPHRINE 40 MCG/ML (10ML) SYRINGE FOR IV PUSH (FOR BLOOD PRESSURE SUPPORT)
80.0000 ug | PREFILLED_SYRINGE | INTRAVENOUS | Status: DC | PRN
  Filled 2014-03-20: qty 10

## 2014-03-20 MED ORDER — FENTANYL 2.5 MCG/ML BUPIVACAINE 1/10 % EPIDURAL INFUSION (WH - ANES)
14.0000 mL/h | INTRAMUSCULAR | Status: DC | PRN
  Administered 2014-03-20: 14 mL/h via EPIDURAL
  Filled 2014-03-20: qty 125

## 2014-03-20 MED ORDER — OXYCODONE-ACETAMINOPHEN 5-325 MG PO TABS: 1.0000 | ORAL_TABLET | ORAL | Status: DC | PRN

## 2014-03-20 MED ORDER — LACTATED RINGERS IV SOLN
500.0000 mL | Freq: Once | INTRAVENOUS | Status: AC
  Administered 2014-03-20: 500 mL via INTRAVENOUS

## 2014-03-20 MED ORDER — PHENYLEPHRINE 40 MCG/ML (10ML) SYRINGE FOR IV PUSH (FOR BLOOD PRESSURE SUPPORT): 80.0000 ug | PREFILLED_SYRINGE | INTRAVENOUS | Status: DC | PRN

## 2014-03-20 SURGICAL SUPPLY — 30 items
CLAMP CORD UMBIL (MISCELLANEOUS) IMPLANT
CLOTH BEACON ORANGE TIMEOUT ST (SAFETY) ×3 IMPLANT
COVER LIGHT HANDLE  1/PK (MISCELLANEOUS)
COVER LIGHT HANDLE 1/PK (MISCELLANEOUS) IMPLANT
DRAIN JACKSON PRT FLT 7MM (DRAIN) IMPLANT
DRAPE SHEET LG 3/4 BI-LAMINATE (DRAPES) IMPLANT
DRSG OPSITE POSTOP 4X10 (GAUZE/BANDAGES/DRESSINGS) ×3 IMPLANT
DURAPREP 26ML APPLICATOR (WOUND CARE) ×3 IMPLANT
ELECT REM PT RETURN 9FT ADLT (ELECTROSURGICAL) ×3
ELECTRODE REM PT RTRN 9FT ADLT (ELECTROSURGICAL) ×1 IMPLANT
EVACUATOR SILICONE 100CC (DRAIN) IMPLANT
EXTRACTOR VACUUM M CUP 4 TUBE (SUCTIONS) IMPLANT
EXTRACTOR VACUUM M CUP 4' TUBE (SUCTIONS)
GLOVE BIO SURGEON STRL SZ7 (GLOVE) ×3 IMPLANT
GLOVE BIOGEL PI IND STRL 7.0 (GLOVE) ×1 IMPLANT
GLOVE BIOGEL PI INDICATOR 7.0 (GLOVE) ×2
GOWN STRL REUS W/TWL LRG LVL3 (GOWN DISPOSABLE) ×6 IMPLANT
KIT ABG SYR 3ML LUER SLIP (SYRINGE) ×3 IMPLANT
NEEDLE HYPO 25X5/8 SAFETYGLIDE (NEEDLE) ×3 IMPLANT
NS IRRIG 1000ML POUR BTL (IV SOLUTION) ×3 IMPLANT
PACK C SECTION WH (CUSTOM PROCEDURE TRAY) ×3 IMPLANT
PAD OB MATERNITY 4.3X12.25 (PERSONAL CARE ITEMS) ×3 IMPLANT
RTRCTR C-SECT PINK 25CM LRG (MISCELLANEOUS) ×3 IMPLANT
SUT MON AB 2-0 CT1 27 (SUTURE) ×3 IMPLANT
SUT VIC AB 0 CTX 36 (SUTURE) ×10
SUT VIC AB 0 CTX36XBRD ANBCTRL (SUTURE) ×5 IMPLANT
SUT VIC AB 4-0 KS 27 (SUTURE) ×3 IMPLANT
TOWEL OR 17X24 6PK STRL BLUE (TOWEL DISPOSABLE) ×3 IMPLANT
TRAY FOLEY CATH 14FR (SET/KITS/TRAYS/PACK) ×3 IMPLANT
WATER STERILE IRR 1000ML POUR (IV SOLUTION) IMPLANT

## 2014-03-20 NOTE — Progress Notes (Signed)
Patient ID: [name], female   DOB: 21-Sep-1980, 33 y.o.   MRN: [id number] Labor Progress Note  ASSESSMENT:   [name] 33 y.o. [id number] at [redacted]w[redacted]d in induced labor   PLAN:  1) Labor curve reviewed.       Progress: Active            Plan: Pitocin augmentation  AROM with clear fluid at 8pm --> now with moderate meconium  2) Fetal heart tracing reviewed.    Cat II, LL decubitus  3) GBS Status - neg  4) Other Problems Active Problems:   Pregnant and not yet delivered   SUBJECTIVE:  Feels relief with epidural. Resting.   OBJECTIVE:  Vital Signs:  Patient Vitals for the past 2 hrs:  BP Temp Temp src Pulse SpO2  03/20/14 2212 - - - 80 98 %  03/20/14 2200 108/52 mmHg 98 F (36.7 C) Oral 82 99 %  03/20/14 2130 116/55 mmHg - - 84 99 %  03/20/14 2129 116/55 mmHg - - 82 99 %  03/20/14 2125 132/73 mmHg - - 79 99 %  03/20/14 2121 136/71 mmHg - - 81 99 %  03/20/14 2120 136/71 mmHg - - 81 99 %  03/20/14 2115 127/73 mmHg - - 79 100 %  03/20/14 2114 127/73 mmHg - - 82 100 %  03/20/14 2110 115/79 mmHg - - 82 96 %  03/20/14 2109 134/78 mmHg - - 88 -  03/20/14 2105 140/100 mmHg - - 87 100 %  03/20/14 2100 137/77 mmHg - - 90 100 %  03/20/14 2055 - - - 82 99 %  03/20/14 2050 - - - 94 98 %   SVE: Dilation: 7, Effacement (%): 90, Station: -2  FHR Monitoring Baseline Rate (A): 135 bpm/+ accels/ +minimal to moderate variability/ variable and late decels   Accelerations: 15 x 15 Contraction Frequency (min): 2-3.5

## 2014-03-20 NOTE — Anesthesia Procedure Notes (Signed)
Epidural Patient location during procedure: OB Start time: 03/20/2014 9:02 PM  Staffing Anesthesiologist: Larone Kliethermes A. Performed by: anesthesiologist   Preanesthetic Checklist Completed: patient identified, site marked, surgical consent, pre-op evaluation, timeout performed, IV checked, risks and benefits discussed and monitors and equipment checked  Epidural Patient position: sitting Prep: site prepped and draped and DuraPrep Patient monitoring: continuous pulse ox and blood pressure Approach: midline Location: L3-L4 Injection technique: LOR air  Needle:  Needle type: Tuohy  Needle gauge: 17 G Needle length: 9 cm and 9 Needle insertion depth: 4 cm Catheter type: closed end flexible Catheter size: 19 Gauge Catheter at skin depth: 9 cm Test dose: negative and Other  Assessment Events: blood not aspirated, injection not painful, no injection resistance, negative IV test and no paresthesia  Additional Notes Patient identified. Risks and benefits discussed including failed block, incomplete  Pain control, post dural puncture headache, nerve damage, paralysis, blood pressure Changes, nausea, vomiting, reactions to medications-both toxic and allergic and post Partum back pain. All questions were answered. Patient expressed understanding and wished to proceed. Sterile technique was used throughout procedure. Epidural site was Dressed with sterile barrier dressing. No paresthesias, signs of intravascular injection Or signs of intrathecal spread were encountered.  Patient was more comfortable after the epidural was dosed. Please see RN's note for documentation of vital signs and FHR which are stable.

## 2014-03-20 NOTE — H&P (Signed)
Attestation of Attending Supervision of Fellow: Evaluation and management procedures were performed by the Fellow under my supervision and collaboration.  I have reviewed the Fellow's note and chart, and I agree with the management and plan.    

## 2014-03-20 NOTE — Progress Notes (Signed)
Patient ID: [name], female   DOB: 1980/12/02, 33 y.o.   MRN: [id number] Labor Progress Note  ASSESSMENT:   Lorraine Cimmino 33 y.o. [id number] at [redacted]w[redacted]d in induced labor   PLAN:  1) Labor curve reviewed.       Progress: Latent             Plan: Pitocin augmentation  AROM with clear fluid at 8pm  2) Fetal heart tracing reviewed.    Cat II, proceed with AROM  3) GBS Status - neg  4) Other Problems Active Problems:   Pregnant and not yet delivered   SUBJECTIVE:  Feels pain with contractions. Desires epidural.   OBJECTIVE:  Vital Signs: Patient Vitals for the past 2 hrs:  BP Temp Temp src Pulse Resp  03/20/14 2001 120/58 mmHg - - 80 -  03/20/14 1931 135/76 mmHg 97.9 F (36.6 C) Oral 86 -  03/20/14 1901 125/61 mmHg - - 84 16  03/20/14 1831 123/66 mmHg - - 84 18   SVE: Dilation: 5, Effacement (%): 50, Station: -2  FHR Monitoring Baseline Rate (A): 145 bpm/+ accels/ +minimal to moderate variability/ variable and late decels   Accelerations: 15 x 15 Contraction Frequency (min): 5-12

## 2014-03-20 NOTE — Telephone Encounter (Signed)
Preadmission screen  

## 2014-03-20 NOTE — Progress Notes (Signed)
Dr. Gala Romney called to room for review of NST tracing and plan of care.  Pt to be admitted now for IOL.  Pt voiced understanding and agreement of plan.

## 2014-03-20 NOTE — H&P (Signed)
LABOR ADMISSION HISTORY AND PHYSICAL  [name] is a 33 y.o. female [phone] with IUP at [redacted]w[redacted]d by LMP presenting for PDIOL and with deceleration on NST for post-dates monitoring.   She reports +FMs, No LOF, no VB, no blurry vision, headaches or peripheral edema, and RUQ pain. She desires an epidural for labor pain control. She plans on breast and bottle feeding. She request natural family planning for birth control. Epidural ordered.  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 No pertinent past medical history   . Medical history non-contributory     Past Surgical History: Past Surgical History  Procedure Laterality Date  . Induced abortion      Obstetrical History: OB History   Grav Para Term Preterm Abortions TAB SAB Ect Mult Living   4 1 1  0 2 2 0 0 0 1      Gynecological History: OB History   Grav Para Term Preterm Abortions TAB SAB Ect Mult Living   4 1 1  0 2 2 0 0 0 1      Social History: History   Social History  . Marital Status: Married    Spouse Name: N/A    Number of Children: N/A  . Years of Education: N/A   Social History Main Topics  . Smoking status: Never Smoker   . Smokeless tobacco: Never Used  . Alcohol Use: No  . Drug Use: No  . Sexual Activity: Not Currently   Other Topics Concern  . None   Social History Narrative   Lived in [place] - To [place] on 10/27/2011    Family History: Family History  Problem Relation Age of Onset  . Anesthesia problems Neg Hx   . Hypotension Neg Hx   . Malignant hyperthermia Neg Hx   . Pseudochol deficiency Neg Hx     Allergies: No Known Allergies  Prescriptions prior to admission  Medication Sig  Dispense Refill  . Prenatal Vit-Fe Fumarate-FA (PRENATAL MULTIVITAMIN) TABS Take 1 tablet by mouth at bedtime.         Review of Systems   All systems reviewed and negative except as stated in HPI  Blood pressure 123/73, pulse 86, temperature 98.2 F (36.8 C), temperature source Oral, resp. rate 18, height 5\' 3"  (1.6 m), weight 149 lb (67.586 kg). General appearance: alert, cooperative and appears stated age Lungs: clear to auscultation bilaterally Heart: regular rate and rhythm Abdomen: soft, non-tender; bowel sounds normal Pelvic: [place] Extremities: Homans sign is negative, no sign of DVT Presentation: cephalic Fetal monitoringBaseline: 135 bpm, Variability: Originally minimal, but moderate after scalp stimulation, Accelerations: Originally no accelerations, but + accels after scalp stim and Decelerations: Shallow late decelerations, resolved after scalp stim Uterine activity: Irregular, infrequent contractions  Dilation: 5 Effacement (%): 50 Station: -2 Exam by:: Dr. Mancel Bale   Prenatal labs: ABO, Rh: B/POS/-- (09/03 0941) Antibody: NEG (09/03 0941) Rubella:   RPR: NON REAC (09/03 0941)  HBsAg: NEGATIVE (09/03 0941)  HIV: NONREACTIVE (09/03 0941)  GBS: Negative (09/03 0000)  2 hr Glucola 73, 95, 100 Genetic screening  Not done Anatomy US WNL   Prenatal Transfer Tool  Maternal Diabetes: No Genetic Screening: Declined Maternal Ultrasounds/Referrals: Normal Fetal Ultrasounds or  other Referrals:  None Maternal Substance Abuse:  No Significant Maternal Medications:  None Significant Maternal Lab Results: Lab values include: Group B Strep negative     No results found for this or any previous visit (from the past 24 hour(s)).  Patient Active Problem List   Diagnosis Date Noted  . Post term pregnancy, antepartum condition or complication 59/02/3111  . Pregnant and not yet delivered 03/20/2014  . Supervision of other normal pregnancy 02/16/2014  . Late prenatal  care affecting pregnancy 02/16/2014  . Nausea & vomiting 12/23/2011  . Fibroid uterus 11/12/2011    Assessment: [name] is a 33 y.o. G4P1021 at [redacted]w[redacted]d here for PDIOL with one late decel on post-dates NST, now with Cat II strip.  #Labor: Start pitocin for augmentation #Pain: Planning for epidural #FWB: Cat II, now Cat I with scalp stimulations #ID:  GBS neg #MOF: Breast + Bottle #MOC: Natural Family Planning #Circ:  Planning for outpatient  [name] 03/20/2014, 5:57 PM

## 2014-03-20 NOTE — Anesthesia Preprocedure Evaluation (Addendum)
Anesthesia Evaluation  Patient identified by MRN, date of birth, ID band Patient awake    Reviewed: Allergy & Precautions, H&P , NPO status , Patient's Chart, lab work & pertinent test results  Airway Mallampati: III TM Distance: >3 FB Neck ROM: full    Dental no notable dental hx. (+) Teeth Intact   Pulmonary neg pulmonary ROS,  breath sounds clear to auscultation  Pulmonary exam normal       Cardiovascular negative cardio ROS  Rhythm:regular Rate:Normal     Neuro/Psych negative neurological ROS  negative psych ROS   GI/Hepatic negative GI ROS, Neg liver ROS,   Endo/Other  negative endocrine ROS  Renal/GU negative Renal ROS  negative genitourinary   Musculoskeletal   Abdominal Normal abdominal exam  (+)   Peds  Hematology negative hematology ROS (+)   Anesthesia Other Findings   Reproductive/Obstetrics (+) Pregnancy                          Anesthesia Physical Anesthesia Plan  ASA: II and emergent  Anesthesia Plan: Epidural   Post-op Pain Management:    Induction:   Airway Management Planned:   Additional Equipment:   Intra-op Plan:   Post-operative Plan:   Informed Consent: I have reviewed the patients History and Physical, chart, labs and discussed the procedure including the risks, benefits and alternatives for the proposed anesthesia with the patient or authorized representative who has indicated his/her understanding and acceptance.     Plan Discussed with: Anesthesiologist, CRNA and Surgeon  Anesthesia Plan Comments: (Stat C/Section for fetal bradycardia. Will use epidural for C/Section.)       Anesthesia Quick Evaluation

## 2014-03-21 ENCOUNTER — Inpatient Hospital Stay (HOSPITAL_COMMUNITY): Payer: Medicaid Other

## 2014-03-21 ENCOUNTER — Encounter (HOSPITAL_COMMUNITY): Payer: Self-pay | Admitting: Anesthesiology

## 2014-03-21 ENCOUNTER — Inpatient Hospital Stay (HOSPITAL_COMMUNITY): Admission: RE | Admit: 2014-03-21 | Payer: Medicaid Other | Source: Ambulatory Visit

## 2014-03-21 DIAGNOSIS — Z3A41 41 weeks gestation of pregnancy: Secondary | ICD-10-CM

## 2014-03-21 DIAGNOSIS — O4593 Premature separation of placenta, unspecified, third trimester: Secondary | ICD-10-CM

## 2014-03-21 LAB — CBC
HCT: 26.8 % — ABNORMAL LOW (ref 36.0–46.0)
HEMATOCRIT: 25.2 % — AB (ref 36.0–46.0)
HEMOGLOBIN: 8.9 g/dL — AB (ref 12.0–15.0)
Hemoglobin: 8.3 g/dL — ABNORMAL LOW (ref 12.0–15.0)
MCH: 30.2 pg (ref 26.0–34.0)
MCH: 30 pg (ref 26.0–34.0)
MCHC: 33.2 g/dL (ref 30.0–36.0)
MCHC: 32.9 g/dL (ref 30.0–36.0)
MCV: 90.8 fL (ref 78.0–100.0)
MCV: 91 fL (ref 78.0–100.0)
Platelets: 211 10*3/uL (ref 150–400)
Platelets: 212 10*3/uL (ref 150–400)
RBC: 2.95 MIL/uL — AB (ref 3.87–5.11)
RBC: 2.77 MIL/uL — AB (ref 3.87–5.11)
RDW: 14.3 % (ref 11.5–15.5)
RDW: 14.3 % (ref 11.5–15.5)
WBC: 17.7 10*3/uL — ABNORMAL HIGH (ref 4.0–10.5)
WBC: 16.3 10*3/uL — ABNORMAL HIGH (ref 4.0–10.5)

## 2014-03-21 LAB — HIV ANTIBODY (ROUTINE TESTING W REFLEX): HIV 1&2 Ab, 4th Generation: NONREACTIVE

## 2014-03-21 MED ORDER — MEASLES, MUMPS & RUBELLA VAC ~~LOC~~ INJ
0.5000 mL | INJECTION | Freq: Once | SUBCUTANEOUS | Status: DC
  Filled 2014-03-21: qty 0.5

## 2014-03-21 MED ORDER — NALBUPHINE HCL 10 MG/ML IJ SOLN: 5.0000 mg | Freq: Once | INTRAMUSCULAR | Status: AC | PRN

## 2014-03-21 MED ORDER — MEPERIDINE HCL 25 MG/ML IJ SOLN: 6.2500 mg | INTRAMUSCULAR | Status: DC | PRN

## 2014-03-21 MED ORDER — PHENYLEPHRINE 40 MCG/ML (10ML) SYRINGE FOR IV PUSH (FOR BLOOD PRESSURE SUPPORT)
PREFILLED_SYRINGE | INTRAVENOUS | Status: AC
  Filled 2014-03-21: qty 5

## 2014-03-21 MED ORDER — DIPHENHYDRAMINE HCL 25 MG PO CAPS
25.0000 mg | ORAL_CAPSULE | Freq: Four times a day (QID) | ORAL | Status: DC | PRN
  Administered 2014-03-21: 25 mg via ORAL

## 2014-03-21 MED ORDER — WITCH HAZEL-GLYCERIN EX PADS: 1.0000 "application " | MEDICATED_PAD | CUTANEOUS | Status: DC | PRN

## 2014-03-21 MED ORDER — SODIUM CHLORIDE 0.9 % IJ SOLN
3.0000 mL | INTRAMUSCULAR | Status: DC | PRN
Start: 2014-03-21 — End: 2014-03-23

## 2014-03-21 MED ORDER — DIPHENHYDRAMINE HCL 25 MG PO CAPS
25.0000 mg | ORAL_CAPSULE | ORAL | Status: DC | PRN
  Filled 2014-03-21: qty 1

## 2014-03-21 MED ORDER — DIBUCAINE 1 % RE OINT: 1.0000 "application " | TOPICAL_OINTMENT | RECTAL | Status: DC | PRN

## 2014-03-21 MED ORDER — PRENATAL MULTIVITAMIN CH
1.0000 | ORAL_TABLET | Freq: Every day | ORAL | Status: DC
  Administered 2014-03-21 – 2014-03-22 (×2): 1 via ORAL
  Filled 2014-03-21 (×2): qty 1

## 2014-03-21 MED ORDER — LANOLIN HYDROUS EX OINT: 1.0000 "application " | TOPICAL_OINTMENT | CUTANEOUS | Status: DC | PRN

## 2014-03-21 MED ORDER — ONDANSETRON HCL 4 MG/2ML IJ SOLN
4.0000 mg | Freq: Three times a day (TID) | INTRAMUSCULAR | Status: DC | PRN
Start: 2014-03-21 — End: 2014-03-23

## 2014-03-21 MED ORDER — NALOXONE HCL 1 MG/ML IJ SOLN
1.0000 ug/kg/h | INTRAVENOUS | Status: DC | PRN
  Filled 2014-03-21: qty 2

## 2014-03-21 MED ORDER — MORPHINE SULFATE (PF) 0.5 MG/ML IJ SOLN
INTRAMUSCULAR | Status: DC | PRN
  Administered 2014-03-21: 2 mg via INTRAVENOUS
  Administered 2014-03-21: 3 mg via EPIDURAL

## 2014-03-21 MED ORDER — OXYTOCIN 10 UNIT/ML IJ SOLN
40.0000 [IU] | INTRAVENOUS | Status: DC | PRN
  Administered 2014-03-20: 40 [IU] via INTRAVENOUS

## 2014-03-21 MED ORDER — SODIUM BICARBONATE 8.4 % IV SOLN
INTRAVENOUS | Status: DC | PRN
  Administered 2014-03-20: 10 mL via EPIDURAL
  Administered 2014-03-20 (×2): 5 mL via EPIDURAL

## 2014-03-21 MED ORDER — ONDANSETRON HCL 4 MG/2ML IJ SOLN: 4.0000 mg | INTRAMUSCULAR | Status: DC | PRN

## 2014-03-21 MED ORDER — NALBUPHINE HCL 10 MG/ML IJ SOLN: 5.0000 mg | INTRAMUSCULAR | Status: DC | PRN

## 2014-03-21 MED ORDER — OXYTOCIN 40 UNITS IN LACTATED RINGERS INFUSION - SIMPLE MED: 62.5000 mL/h | INTRAVENOUS | Status: AC

## 2014-03-21 MED ORDER — OXYCODONE-ACETAMINOPHEN 5-325 MG PO TABS
1.0000 | ORAL_TABLET | ORAL | Status: DC | PRN
  Administered 2014-03-22 – 2014-03-23 (×2): 1 via ORAL
  Filled 2014-03-21 (×2): qty 1

## 2014-03-21 MED ORDER — SIMETHICONE 80 MG PO CHEW
80.0000 mg | CHEWABLE_TABLET | ORAL | Status: DC
  Administered 2014-03-22 – 2014-03-23 (×2): 80 mg via ORAL
  Filled 2014-03-21 (×2): qty 1

## 2014-03-21 MED ORDER — OXYTOCIN 10 UNIT/ML IJ SOLN
INTRAMUSCULAR | Status: AC
  Filled 2014-03-21: qty 4

## 2014-03-21 MED ORDER — NALOXONE HCL 0.4 MG/ML IJ SOLN
0.4000 mg | INTRAMUSCULAR | Status: DC | PRN
Start: 2014-03-21 — End: 2014-03-23

## 2014-03-21 MED ORDER — OXYCODONE-ACETAMINOPHEN 5-325 MG PO TABS
2.0000 | ORAL_TABLET | ORAL | Status: DC | PRN
  Administered 2014-03-22: 2 via ORAL
  Filled 2014-03-21: qty 2

## 2014-03-21 MED ORDER — ONDANSETRON HCL 4 MG/2ML IJ SOLN
INTRAMUSCULAR | Status: AC
  Filled 2014-03-21: qty 2

## 2014-03-21 MED ORDER — LACTATED RINGERS IV SOLN: INTRAVENOUS | Status: DC

## 2014-03-21 MED ORDER — MENTHOL 3 MG MT LOZG: 1.0000 | LOZENGE | OROMUCOSAL | Status: DC | PRN

## 2014-03-21 MED ORDER — FENTANYL CITRATE 0.05 MG/ML IJ SOLN: 25.0000 ug | INTRAMUSCULAR | Status: DC | PRN

## 2014-03-21 MED ORDER — DEXAMETHASONE SODIUM PHOSPHATE 10 MG/ML IJ SOLN
INTRAMUSCULAR | Status: DC | PRN
  Administered 2014-03-20: 10 mg via INTRAVENOUS

## 2014-03-21 MED ORDER — SCOPOLAMINE 1 MG/3DAYS TD PT72
MEDICATED_PATCH | TRANSDERMAL | Status: DC | PRN
  Administered 2014-03-20: 1 via TRANSDERMAL

## 2014-03-21 MED ORDER — DEXAMETHASONE SODIUM PHOSPHATE 10 MG/ML IJ SOLN
INTRAMUSCULAR | Status: AC
  Filled 2014-03-21: qty 1

## 2014-03-21 MED ORDER — ONDANSETRON HCL 4 MG PO TABS: 4.0000 mg | ORAL_TABLET | ORAL | Status: DC | PRN

## 2014-03-21 MED ORDER — IBUPROFEN 600 MG PO TABS
600.0000 mg | ORAL_TABLET | Freq: Four times a day (QID) | ORAL | Status: DC
  Administered 2014-03-21 – 2014-03-23 (×8): 600 mg via ORAL
  Filled 2014-03-21 (×9): qty 1

## 2014-03-21 MED ORDER — DIPHENHYDRAMINE HCL 50 MG/ML IJ SOLN: 12.5000 mg | INTRAMUSCULAR | Status: DC | PRN

## 2014-03-21 MED ORDER — SENNOSIDES-DOCUSATE SODIUM 8.6-50 MG PO TABS
2.0000 | ORAL_TABLET | ORAL | Status: DC
  Administered 2014-03-22 – 2014-03-23 (×2): 2 via ORAL
  Filled 2014-03-21 (×2): qty 2

## 2014-03-21 MED ORDER — TETANUS-DIPHTH-ACELL PERTUSSIS 5-2.5-18.5 LF-MCG/0.5 IM SUSP: 0.5000 mL | Freq: Once | INTRAMUSCULAR | Status: DC

## 2014-03-21 MED ORDER — SCOPOLAMINE 1 MG/3DAYS TD PT72
1.0000 | MEDICATED_PATCH | Freq: Once | TRANSDERMAL | Status: DC
  Filled 2014-03-21: qty 1

## 2014-03-21 MED ORDER — MORPHINE SULFATE 0.5 MG/ML IJ SOLN
INTRAMUSCULAR | Status: AC
  Filled 2014-03-21: qty 10

## 2014-03-21 MED ORDER — SIMETHICONE 80 MG PO CHEW
80.0000 mg | CHEWABLE_TABLET | ORAL | Status: DC | PRN
  Administered 2014-03-21: 80 mg via ORAL
  Filled 2014-03-21: qty 1

## 2014-03-21 NOTE — Progress Notes (Signed)
Clinical Social Work Department PSYCHOSOCIAL ASSESSMENT - MATERNAL/CHILD 03/21/2014  Patient:  [name], [name]  Account Number:  [id number]  [place] Date:  03/20/2014  Ardine Eng Name:   Ulyess Mort   Clinical Social Worker:  Lucita Ferrara, CLINICAL SOCIAL WORKER   Date/Time:  03/21/2014 02:30 PM  Date Referred:  03/21/2014   Referral source  Central Nursery     Referred reason  [facility]   Other referral source:    I:  FAMILY / HOME ENVIRONMENT Child's legal guardian:  PARENT  Guardian - Name Guardian - Age Guardian - Address  [name] [address] [place], [place] [address]  Father of the baby  in [place]   Other household support members/support persons Name Relationship DOB   MOTHER    AUNT    Other support:   MGM present as support person at the hospital.  MOB stated that she lives with her aunt.  She shared that she is well supported by her family in the [place].    II  PSYCHOSOCIAL DATA Information Source:  Patient Interview  Occupational hygienist Employment:   CSW did not assess for employment status.   Financial resources:  Medicaid If Medicaid - County:  [place] / Grade:  N/A Music therapist / Child Services Coordination / Early Interventions:   N/A  Cultural issues impacting care:   MOB reported that the FOB and her 62 year old son live in [place].  MOB stated that she goes between the [place] and [place].    III  STRENGTHS Strengths  Adequate Resources  Home prepared for Child (including basic supplies)  Supportive family/friends   Strength comment:    IV  RISK FACTORS AND CURRENT PROBLEMS Current Problem:  YES   Risk Factor & Current Problem Patient Issue Family Issue Risk Factor / Current Problem Comment  Other - See comment Y N MOB arrived for initial prenatal appointment in the [place] at 10 weeks.  Per MOB, she received regular prenatal appointments while living in [place]. The MOB's UDS  is negative on 02/16/14.  The baby's UDS is negative.     V  SOCIAL WORK ASSESSMENT CSW met with the MOB in her room in order to complete the assessment. Consult was ordered due to MOB receiving initial prenatal appointment at 25 weeks.  MOB was receptive to the [place] visit and provided consent for the [facility] to be present for the visit.  MOB presented with limited range in affect, but she stated that she continued to feel tired following the emergency C-Section.  MOB presented as polite and present despite her voicing frustration with the drug screen policy. MOB also presented as guarded with her feelings, but was willing to discuss factual information with the CSW.   MOB shared that she is excited upon the birth of her second child.  MOB minimized her feelings, but did acknowledge feeling stressed and sad since the FOB and her 40 year old son are living in [place] and have been absent for the birth of the baby.  MOB shared that the FOB and their son live in [place] due to the FOB's work, and that they will not be reunited for 2 months.  She stated that she travels between the [place] and [place], and that she returned to the [place] about 2-3 months ago.  CSW provided supportive listening and validated her feelings of sadness due to the separation, but MOB repeatedly stated "I'm doing  okay".  She stated that she receives the support from her family, and discussed how she currently lives with her aunt.  MOB also stated that she is able to utilize technology in order to remain in contact with the FOB during their physical separation from one another.    When CSW inquired about prenatal care, she stated that she attended monthly prenatal visits in [place] prior to traveling back to the [place].  She offered to provide supporting documentation to the treatment team if needed.  MOB stated that she initiated care once she moved back to the [place].  MOB denied barriers to attending follow-up  appointments.   CSW discussed hospital drug screen policy when providers are unable to verify prenatal appointments.  MOB stated that she did not want her baby drug screen.  She denied any substance use history and stated that the FOB does not engage in substance use either.   MOB denied mental health history and denied history of postpartum depression.  MOB stated that she was not familiar with the concept of postpartum depression, and CSW discussed how it occurs to some women after giving birth.  MOB expressed surprise that some women experience postpartum depression, but was agreeable to talking to her MD if she experiences symptoms.    No barriers to discharge. Please notify CSW with additional questions or concerns.   VI SOCIAL WORK PLAN Social Work Therapist, art  No Further Intervention Required / No Barriers to Discharge   Type of pt/family education:   Postpartum depression and hospital drug screen policy   If child protective services report - county:   If child protective services report - date:   Information/referral to community resources comment:   No referrals needed at this time.   Other social work plan:   CSW notified MD of MOB's request to not drug screen the baby.

## 2014-03-21 NOTE — Progress Notes (Signed)
Unable to void x2 at 1700 and 1945, bladder scan done 917ml found in out cath done.

## 2014-03-21 NOTE — Anesthesia Postprocedure Evaluation (Signed)
  Anesthesia Post-op Note  Patient: [name]  Procedure(s) Performed: Procedure(s): CESAREAN SECTION (N/A)  Patient Location: PACU and Mother/Baby  Anesthesia Type:Epidural  Level of Consciousness: awake, alert  and oriented  Airway and Oxygen Therapy: Patient Spontanous Breathing  Post-op Pain: mild  Post-op Assessment: Post-op Vital signs reviewed, Patient's Cardiovascular Status Stable, Respiratory Function Stable, Patent Airway, No signs of Nausea or vomiting, Adequate PO intake, Pain level controlled, No headache, No backache, No residual numbness and No residual motor weakness  Post-op Vital Signs: Reviewed and stable  Last Vitals:  Filed Vitals:   03/21/14 0540  BP: 124/56  Pulse: 76  Temp: 36.9 C  Resp: 16    Complications: No apparent anesthesia complications

## 2014-03-21 NOTE — Anesthesia Postprocedure Evaluation (Signed)
  Anesthesia Post-op Note  Patient: Lobbyist  Procedure(s) Performed: Procedure(s): CESAREAN SECTION (N/A)  Patient Location: PACU  Anesthesia Type:Epidural  Level of Consciousness: awake, alert  and oriented  Airway and Oxygen Therapy: Patient Spontanous Breathing  Post-op Pain: none  Post-op Assessment: Post-op Vital signs reviewed, Patient's Cardiovascular Status Stable, Respiratory Function Stable, Patent Airway, No signs of Nausea or vomiting, Pain level controlled, No headache and No backache  Post-op Vital Signs: Reviewed and stable  Last Vitals:  Filed Vitals:   03/21/14 0220  BP: 107/52  Pulse: 78  Temp: 36.6 C  Resp: 16    Complications: No apparent anesthesia complications

## 2014-03-21 NOTE — OR Nursing (Signed)
Counts not done to emergency case. Xray done at end of procedure

## 2014-03-21 NOTE — Op Note (Signed)
[name] PROCEDURE DATE: 03/20/2014 - 03/21/2014  PREOPERATIVE DIAGNOSIS: Intrauterine pregnancy at  [redacted]w[redacted]d weeks gestation with placental abruption  POSTOPERATIVE DIAGNOSIS: Intrauterine pregnancy at  [redacted]w[redacted]d weeks gestation with placental abruption  PROCEDURE:    Low Transverse Cesarean Section  SURGEON:  Dr. Silas Sacramento  ASSISTANT: Josephine Cables, MD  INDICATIONS: [name] is a 33 y.o. [phone] at [redacted]w[redacted]d with sudden onset of heavy vaginal bleeding and non reassuring fetal heart tones.  The risks of cesarean section discussed with the patient included but were not limited to: bleeding which may require transfusion or reoperation; infection which may require antibiotics; injury to bowel, bladder, ureters or other surrounding organs; injury to the fetus; need for additional procedures including hysterectomy in the event of a life-threatening hemorrhage; placental abnormalities wth subsequent pregnancies, incisional problems, thromboembolic phenomenon and other postoperative/anesthesia complications. The patient concurred with the proposed plan, giving informed written consent for the procedure.    FINDINGS:  Viable female  infant in cephalic presentation, 8,9 Apgars, weight 8 pounds 8 ounces, thick meconium and bloody amniotic fluid.  Intact placenta, three vessel cord.  Grossly normal uterus with central fibroid, ovaries and fallopian tubes.  Arterial cord blood gas = 7.078 with base excess of 8.8 .   ANESTHESIA:    Epidural  ESTIMATED BLOOD LOSS: 1000  SPECIMENS: Placenta sent to placenta  COMPLICATIONS: None immediate  PROCEDURE IN DETAIL:  The patient received intravenous antibiotics and had sequential compression devices applied to her lower extremities.  Epidural anesthesia was dosed up to surgical level and was found to be adequate. She was then placed in a dorsal supine position with a leftward tilt, and the abdomen was splashed with bedat.  A foley catheter was placed  into her bladder and attached to constant gravity.  After an adequate timeout was performed, a Pfannenstiel skin incision was made with scalpel and carried through to the underlying layer of fascia. The fascia was incised in the midline and this incision was extended bilaterally using the Mayo scissors. Kocher clamps were applied to the superior aspect of the fascial incision and the underlying rectus muscles were dissected off bluntly. A similar process was carried out on the inferior aspect of the facial incision. The rectus muscles were separated in the midline bluntly and the peritoneum was entered bluntly.   A transverse hysterotomy was made with a scalpel and extended bilaterally bluntly. The bladder blade was then removed. The infant was successfully delivered, and cord was clamped and cut and infant was handed over to awaiting neonatology team. Uterine massage was then administered and the placenta delivered intact with three-vessel cord. The uterus was cleared of clot and debris.  The hysterotomy was closed with 0 vicryl.  A second imbricating suture of 0-Vicryl was used to reinforce the incision and aid in hemostasis.  The peritoneum and rectus muscles were noted to be hemostatic.  The fascia was closed with 0-Vicryl in a running fashion with good restoration of anatomy.  The subcutaneus tissue was copiously irrigated.  The skin was closed with 4-0 Vicryl in a subcuticular fashion.    Pt tolerated the procedure will.  All counts were correct x2.  Pt went to the recovery room in stable condition.

## 2014-03-21 NOTE — Progress Notes (Signed)
Ur chart review completed.  

## 2014-03-21 NOTE — Transfer of Care (Signed)
Immediate Anesthesia Transfer of Care Note  Patient: [name]  Procedure(s) Performed: Procedure(s): CESAREAN SECTION (N/A)  Patient Location: PACU  Anesthesia Type:Epidural  Level of Consciousness: awake, alert , oriented, patient cooperative and responds to stimulation  Airway & Oxygen Therapy: Patient Spontanous Breathing  Post-op Assessment: Report given to PACU RN and Post -op Vital signs reviewed and stable  Post vital signs: Reviewed and stable  Complications: No apparent anesthesia complications

## 2014-03-21 NOTE — Addendum Note (Signed)
Addendum created 03/21/14 5631 by Lyda Jester, CRNA   Modules edited: Notes Section   Notes Section:  File: [id number]

## 2014-03-21 NOTE — Lactation Note (Signed)
This note was copied from the chart of [name]. Lactation Consultation Note    Report from this mom of a term baby, now 61 hours old. Mom had a PPH, EBL 1200 mls, and hgb down to 8.3. Mom's nurse, Reuben Likes,  explained to mom that since she does not feel well enough to breast feed right now, she should begin pumping to protect her milk supply. Mom has refused this up to now, most recent reason is thatnher mom(MGM) is praying right now, and she does snot want to disturb her.  Mom is formula feed ing baby at this time.  Patient Name: [name] Date: 03/21/2014 Reason for consult: Other (Comment) (note from nurse report)   Maternal Data Formula Feeding for Exclusion: Yes Reason for exclusion: Mother's choice to formula and breast feed on admission  Feeding Feeding Type: Bottle Fed - Formula Nipple Type: Slow - flow  LATCH Score/Interventions                      Lactation Tools Discussed/Used Tools: Bottle   Consult Status Consult Status: Follow-up Date: 03/21/14 Follow-up type: In-patient    Tonna Corner 03/21/2014, 5:28 PM

## 2014-03-21 NOTE — Progress Notes (Signed)
Post Partum Day 1 Subjective:  [name] is a 33 y.o. [id number] [redacted]w[redacted]d s/p PLTCS for abruption.  No acute events overnight.  Pt denies problems with ambulating, voiding or po intake.  She denies nausea or vomiting.  Pain is moderately controlled.  She has had flatus. She has not had bowel movement.  Lochia Small.  Plan for birth control is natural family planning (NFP).  Method of Feeding: breast and bottle  Objective: Blood pressure 124/56, pulse 76, temperature 98.4 F (36.9 C), temperature source Oral, resp. rate 16, height 5\' 3"  (1.6 m), weight 149 lb (67.586 kg), SpO2 96.00%, unknown if currently breastfeeding.  Physical Exam:  General: alert, cooperative and no distress Lochia:normal flow Chest: CTAB Heart: RRR no m/r/g Abdomen: +BS, soft, nontender, incision dressing clean, dry, and intact Uterine Fundus: firm DVT Evaluation: No evidence of DVT seen on physical exam. Extremities: no edema   Recent Labs  03/20/14 1745 03/21/14 0605  HGB 12.7 8.9*  HCT 37.7 26.8*    Assessment/Plan:  ASSESSMENT: [name] is a 33 y.o. [id number] [redacted]w[redacted]d s/p PLTCS for abruption  #Pain: Percocet, ibuprofen prn #MOF: Breast + Bottle  #MOC: Natural Family Planning  #Circ: Planning for outpatient  Potential discharge on POD#2   LOS: 1 day   Dimas Chyle 03/21/2014, 8:57 AM   Pt seen and examined. Hgb dropped almost 4 gms.  Pt received aggressive volume in the OR.  Will recheck cbc to make sure it is stable.  UOP 400 cc over past 3 hours.  Will also start iron and colace.  Robertlee Rogacki H. ,now

## 2014-03-22 ENCOUNTER — Encounter (HOSPITAL_COMMUNITY): Payer: Self-pay | Admitting: Obstetrics & Gynecology

## 2014-03-22 NOTE — Progress Notes (Signed)
Post Partum Day 2 Subjective:  [name] is a 33 y.o. [id number] [redacted]w[redacted]d s/p PLTCS for abruption.  No acute events overnight.  Pt denies problems with ambulating, or po intake.  She denies nausea or vomiting.  Pain is moderately controlled.  She has had flatus. She has not had bowel movement.  Lochia Small.  Plan for birth control is natural family planning (NFP).  Method of Feeding: Breast and bottle  Patient did have urinary retention yesterday with bladder scan showing 981mL. In and out cath was performed. Patient reports that she has been able to void spontaneously once since then.  Objective: Blood pressure 100/53, pulse 71, temperature 98 F (36.7 C), temperature source Oral, resp. rate 18, height 5\' 3"  (1.6 m), weight 149 lb (67.586 kg), SpO2 97.00%, unknown if currently breastfeeding.  Physical Exam:  General: alert, cooperative and no distress Lochia:normal flow Chest: CTAB Heart: RRR no m/r/g Abdomen: +BS, soft, nontender,  Uterine Fundus: firm DVT Evaluation: No evidence of DVT seen on physical exam. Extremities: no edema   Recent Labs  03/21/14 0605 03/21/14 0906  HGB 8.9* 8.3*  HCT 26.8* 25.2*    Assessment/Plan:  ASSESSMENT: [name] is a 33 y.o. [id number] [redacted]w[redacted]d s/p PLTCS for abruption  #Heme: HgB stable #Pain: Percocet, ibuprofen prn  #MOF: Breast + Bottle  #MOC: Natural Family Planning  #Circ: Planning for outpatient  Potential discharge tomorrow pending adequate urine output.   LOS: 2 days   Dimas Chyle 03/22/2014, 8:00 AM   OB fellow attestation Post Partum Day 1 I have seen and examined this patient and agree with above documentation in the resident's note.   [name] is a 32 y.o. R7E0814 s/p pLTCS 2/2 abruption.  Pt denies problems with ambulating, voiding or po intake. Pain is well controlled. Underwent catheterization with ~1L urine outpt, concern for urinary retention, however currently urinating without difficulty.  PE:   BP 114/61  Pulse 77  Temp(Src) 99 F (37.2 C) (Oral)  Resp 18  Ht 5\' 3"  (1.6 m)  Wt 149 lb (67.586 kg)  BMI 26.40 kg/m2  SpO2 97%  Breastfeeding? Unknown Fundus firm  Plan for discharge: tomorrow  Merla Riches, MD 9:34 AM

## 2014-03-22 NOTE — Lactation Note (Signed)
This note was copied from the chart of [place]. Lactation Consultation Note  Follow up assessment done.  Mom states she puts baby to breast and then supplements with a small amount of formula .  Mom also using DEBP when baby not latching well.  Mom recently pumped 5 mls of tan colored milk and baby took milk well with bottle.  Mom states baby is having some difficulty on right breast.  Right areola firm and swollen.  Assisted with positioning baby in cross cradle hold on right side but baby unable to latch.  Repositioned baby in football hold and baby latched well and nursed actively. Instructed to allow baby to finish feeding on his own and then offer opposite breast.  Demonstrated breast massage for easier flow and intake.  Encouraged to call with concerns/assist prn.  Patient Name: [name] Date: 03/22/2014 Reason for consult: Follow-up assessment   Maternal Data    Feeding Feeding Type: Breast Fed Length of feed: 10 min  LATCH Score/Interventions Latch: Grasps breast easily, tongue down, lips flanged, rhythmical sucking. Intervention(s): Adjust position;Assist with latch;Breast massage;Breast compression  Audible Swallowing: A few with stimulation  Type of Nipple: Everted at rest and after stimulation  Comfort (Breast/Nipple): Soft / non-tender     Hold (Positioning): Assistance needed to correctly position infant at breast and maintain latch. Intervention(s): Breastfeeding basics reviewed;Support Pillows;Position options  LATCH Score: 8  Lactation Tools Discussed/Used     Consult Status Consult Status: Follow-up Date: 03/23/14 Follow-up type: In-patient    Ave Filter 03/22/2014, 2:49 PM

## 2014-03-23 MED ORDER — OXYCODONE-ACETAMINOPHEN 5-325 MG PO TABS: 1.0000 | ORAL_TABLET | ORAL | Status: DC | PRN

## 2014-03-23 MED ORDER — CYCLOBENZAPRINE HCL 5 MG PO TABS: 5.0000 mg | ORAL_TABLET | Freq: Three times a day (TID) | ORAL | Status: DC | PRN

## 2014-03-23 MED ORDER — IBUPROFEN 600 MG PO TABS: 600.0000 mg | ORAL_TABLET | Freq: Four times a day (QID) | ORAL | Status: DC

## 2014-03-23 MED ORDER — CYCLOBENZAPRINE HCL 5 MG PO TABS
5.0000 mg | ORAL_TABLET | Freq: Three times a day (TID) | ORAL | Status: DC | PRN
  Administered 2014-03-23: 5 mg via ORAL
  Filled 2014-03-23: qty 1

## 2014-03-23 NOTE — Discharge Summary (Signed)
Obstetric Discharge Summary Reason for Admission: induction of labor secondary to post dates Prenatal Procedures: none Intrapartum Procedures: cesarean: low cervical, transverse secondary to placental abruption Postpartum Procedures: none Complications-Operative and Postpartum: none  Hospital Course:  Patient admitted for IOL 2/2 post dates. Started on pitocin with AROM. Developed sudden onset heavy vaginal bleeding non-reassuring FHT with several late and variable decels. Patient then underwent emergent c-section for abruption.   Operative Note [name]  PROCEDURE DATE: 03/20/2014 - 03/21/2014  PREOPERATIVE DIAGNOSIS: Intrauterine pregnancy at [redacted]w[redacted]d weeks gestation with placental abruption  POSTOPERATIVE DIAGNOSIS: Intrauterine pregnancy at [redacted]w[redacted]d weeks gestation with placental abruption  PROCEDURE: Low Transverse Cesarean Section  SURGEON: Dr. Silas [place]  ASSISTANT: Josephine Cables, MD  INDICATIONS: [name] is a 33 y.o. P5T6144 at [redacted]w[redacted]d with sudden onset of heavy vaginal bleeding and non reassuring fetal heart tones. The risks of cesarean section discussed with the patient included but were not limited to: bleeding which may require transfusion or reoperation; infection which may require antibiotics; injury to bowel, bladder, ureters or other surrounding organs; injury to the fetus; need for additional procedures including hysterectomy in the event of a life-threatening hemorrhage; placental abnormalities wth subsequent pregnancies, incisional problems, thromboembolic phenomenon and other postoperative/anesthesia complications. The patient concurred with the proposed plan, giving informed written consent for the procedure.   FINDINGS: Viable female infant in cephalic presentation, 8,9 Apgars, weight 8 pounds 8 ounces, thick meconium and bloody amniotic fluid. Intact placenta, three vessel cord. Grossly normal uterus with central fibroid, ovaries and fallopian tubes. Arterial  cord blood gas = 7.078 with base excess of 8.8  .   ANESTHESIA: Epidural  ESTIMATED BLOOD LOSS: 1000  SPECIMENS: Placenta sent to placenta  COMPLICATIONS: None immediate   PROCEDURE IN DETAIL: The patient received intravenous antibiotics and had sequential compression devices applied to her lower extremities. Epidural anesthesia was dosed up to surgical level and was found to be adequate. She was then placed in a dorsal supine position with a leftward tilt, and the abdomen was splashed with bedat. A foley catheter was placed into her bladder and attached to constant gravity. After an adequate timeout was performed, a Pfannenstiel skin incision was made with scalpel and carried through to the underlying layer of fascia. The fascia was incised in the midline and this incision was extended bilaterally using the Mayo scissors. Kocher clamps were applied to the superior aspect of the fascial incision and the underlying rectus muscles were dissected off bluntly. A similar process was carried out on the inferior aspect of the facial incision. The rectus muscles were separated in the midline bluntly and the peritoneum was entered bluntly. A transverse hysterotomy was made with a scalpel and extended bilaterally bluntly. The bladder blade was then removed. The infant was successfully delivered, and cord was clamped and cut and infant was handed over to awaiting neonatology team. Uterine massage was then administered and the placenta delivered intact with three-vessel cord. The uterus was cleared of clot and debris. The hysterotomy was closed with 0 vicryl. A second imbricating suture of 0-Vicryl was used to reinforce the incision and aid in hemostasis. The peritoneum and rectus muscles were noted to be hemostatic. The fascia was closed with 0-Vicryl in a running fashion with good restoration of anatomy. The subcutaneus tissue was copiously irrigated. The skin was closed with 4-0 Vicryl in a subcuticular fashion.  Pt  tolerated the procedure will. All counts were correct x2. Pt went to the recovery room in stable condition  The patient did well posteroperatively with stable HgB. She did have some postoperative urinary retention which resolved spontaneously. At the time of discharge, Pt denied problems with ambulating, or po intake. She denied nausea or vomiting. Her pain was moderately controlled. She had flatus and small lochia.    H/H: Lab Results  Component Value Date/Time   HGB 8.3* 03/21/2014  9:06 AM   HCT 25.2* 03/21/2014  9:06 AM    Filed Vitals:   03/23/14 0624  BP: 114/61  Pulse: 77  Temp: 99 F (37.2 C)  Resp: 18    Physical Exam: VSS NAD Abd: Appropriately tender, ND, Fundus fim Incision: dressing clean, dry, intact No c/c/e, Neg homan's sign, neg cords Lochia Appropriate  Discharge Diagnoses: Post-date pregnancy and placental abruption, delivered via c-section  Discharge Information: Date: 12/26/2010 Activity: pelvic rest Diet: routine  Medications: PNV, Ibuprofen, Percocet and flexeril Breast feeding:  Yes Condition: stable Instructions: refer to handout Discharge to: home      Medication List         cyclobenzaprine 5 MG tablet  Commonly known as:  FLEXERIL  Take 1 tablet (5 mg total) by mouth 3 (three) times daily as needed for muscle spasms.     ibuprofen 600 MG tablet  Commonly known as:  ADVIL,MOTRIN  Take 1 tablet (600 mg total) by mouth every 6 (six) hours.     oxyCODONE-acetaminophen 5-325 MG per tablet  Commonly known as:  PERCOCET/ROXICET  Take 1-2 tablets by mouth every 4 (four) hours as needed for moderate pain or severe pain.     prenatal multivitamin Tabs tablet  Take 1 tablet by mouth at bedtime.       Follow-up Information   Follow up with [facility]. Schedule an appointment as soon as possible for a visit in 6 weeks. (for follow up)    Specialty:  Obstetrics and Gynecology   Contact information:   [place] [address] [phone]      Dimas Chyle 03/23/2014,11:03 AM  I have seen and examined this patient and I agree with the above. Serita Grammes  CNM  8:41 AM 03/25/2014

## 2014-03-23 NOTE — Discharge Instructions (Signed)

## 2014-03-25 NOTE — Discharge Summary (Signed)
`````  Attestation of Attending Supervision of Advanced Practitioner: Evaluation and management procedures were performed by the PA/NP/CNM/OB Fellow under my supervision/collaboration. Chart reviewed and agree with management and plan.  Akya Fiorello V 03/25/2014 7:59 PM

## 2014-04-06 ENCOUNTER — Telehealth: Payer: Self-pay | Admitting: *Deleted

## 2014-04-06 DIAGNOSIS — IMO0001 Reserved for inherently not codable concepts without codable children: Secondary | ICD-10-CM

## 2014-04-06 MED ORDER — PRENATAL VITAMINS 0.8 MG PO TABS: 1.0000 | ORAL_TABLET | Freq: Every day | ORAL | Status: AC

## 2014-04-06 NOTE — Telephone Encounter (Signed)
Pt stopped by clinic requesting refill on PNV to continue taking as she is breast feeding. Rx sent to pharmacy.

## 2014-04-17 ENCOUNTER — Encounter (HOSPITAL_COMMUNITY): Payer: Self-pay | Admitting: Obstetrics & Gynecology

## 2014-04-20 ENCOUNTER — Ambulatory Visit: Payer: Medicaid Other | Admitting: Obstetrics & Gynecology

## 2014-04-20 ENCOUNTER — Encounter: Payer: Self-pay | Admitting: Obstetrics and Gynecology

## 2014-04-20 ENCOUNTER — Ambulatory Visit (INDEPENDENT_AMBULATORY_CARE_PROVIDER_SITE_OTHER): Payer: Medicaid Other | Admitting: Obstetrics and Gynecology

## 2014-04-20 DIAGNOSIS — O4590 Premature separation of placenta, unspecified, unspecified trimester: Secondary | ICD-10-CM | POA: Insufficient documentation

## 2014-04-20 NOTE — Progress Notes (Signed)
  Subjective:     [name] is a 33 y.o. female [phone] who presents for a postpartum visit. She is 4 weeks postpartum following a low cervical transverse Cesarean section for placental abruption in active labor. I have fully reviewed the prenatal and intrapartum course. The delivery was at 40 gestational weeks. Outcome: primary cesarean section, low transverse incision. Anesthesia: epidural. Postpartum course has been uncomplicated. Baby's course has been uncomplicated. Baby is feeding by breast. Bleeding no bleeding. Bowel function is normal. Bladder function is normal. Patient is not sexually active. Contraception method is abstinence. Postpartum depression screening: negative.  The following portions of the patient's history were reviewed and updated as appropriate: allergies, current medications, past family history, past medical history, past social history, past surgical history and problem list.  Review of Systems Pertinent items are noted in HPI.   Objective:    BP 114/72 mmHg  Pulse 74  Temp(Src) 97.5 F (36.4 C) (Oral)  Ht 5\' 3"  (1.6 m)  Wt 62.097 kg (136 lb 14.4 oz)  BMI 24.26 kg/m2  Breastfeeding? Yes  General:  alert, cooperative and no distress   Breasts:  inspection negative, no nipple discharge or bleeding, no masses or nodularity palpable  Lungs: clear to auscultation bilaterally  Heart:  regular rate and rhythm, S1, S2 normal, no murmur, click, rub or gallop  Abdomen: soft, non-tender; bowel sounds normal; no masses,  no organomegaly Uterus well involuted. Incision well healed. Steri-strips removed   Vulva:  normal  Vagina: normal vagina  Cervix:  no cervical motion tenderness  Corpus: normal size, contour, position, consistency, mobility, non-tender  Adnexa:  no mass, fullness, tenderness  Rectal Exam: Not performed.        Assessment:     4 wks postpartum exam. Pap smear not done at today's visit.   Plan:    1. Contraception: abstinence 2. Call for  appointment when contraception desired ([place] for the next year) 3. Follow up in: 6 months or as needed.

## 2014-04-20 NOTE — Patient Instructions (Signed)

## 2014-05-30 ENCOUNTER — Encounter: Payer: Self-pay | Admitting: *Deleted
